# Patient Record
Sex: Female | Born: 1990 | Race: Black or African American | Hispanic: No | Marital: Single | Smoking: Never smoker
Health system: Southern US, Community
[De-identification: ages and names within clinical notes are randomized; demographics above are authoritative.]

## PROBLEM LIST (undated history)

## (undated) DIAGNOSIS — E669 Obesity, unspecified: Secondary | ICD-10-CM

## (undated) DIAGNOSIS — F329 Major depressive disorder, single episode, unspecified: Secondary | ICD-10-CM

## (undated) DIAGNOSIS — B009 Herpesviral infection, unspecified: Secondary | ICD-10-CM

## (undated) DIAGNOSIS — F32A Depression, unspecified: Secondary | ICD-10-CM

## (undated) DIAGNOSIS — F419 Anxiety disorder, unspecified: Secondary | ICD-10-CM

## (undated) HISTORY — PX: CLOSED REDUCTION WRIST FRACTURE: SHX1091

## (undated) HISTORY — DX: Depression, unspecified: F32.A

## (undated) HISTORY — DX: Major depressive disorder, single episode, unspecified: F32.9

---

## 2013-05-24 ENCOUNTER — Ambulatory Visit: Payer: Self-pay

## 2013-09-18 ENCOUNTER — Telehealth: Payer: Self-pay | Admitting: Nurse Practitioner

## 2013-09-18 NOTE — Telephone Encounter (Signed)
appt made

## 2013-09-28 ENCOUNTER — Ambulatory Visit: Payer: Self-pay | Admitting: Nurse Practitioner

## 2013-10-01 ENCOUNTER — Encounter: Payer: Self-pay | Admitting: Nurse Practitioner

## 2013-10-01 ENCOUNTER — Encounter: Payer: Self-pay | Admitting: Family Medicine

## 2013-10-01 ENCOUNTER — Ambulatory Visit (INDEPENDENT_AMBULATORY_CARE_PROVIDER_SITE_OTHER): Payer: 59 | Admitting: Nurse Practitioner

## 2013-10-01 VITALS — BP 120/74 | HR 76 | Temp 99.3°F | Ht 62.0 in | Wt 243.0 lb

## 2013-10-01 DIAGNOSIS — F411 Generalized anxiety disorder: Secondary | ICD-10-CM

## 2013-10-01 MED ORDER — ESCITALOPRAM OXALATE 10 MG PO TABS: 10.0000 mg | ORAL_TABLET | Freq: Every day | ORAL | Status: DC

## 2013-10-01 NOTE — Progress Notes (Signed)
  Subjective:    Patient ID: Angelica Bond, female    DOB: Jan 15, 1991, 22 y.o.   MRN: [id number]  HPI  Patient in  to discuss depression- She was put on Wellbutrin at the age of 66 and she said it did not help at all. Patient said that lately she has been having some anxiety- She says that her consciences tells her  not to do something then she goes and does it anyway and then she stresses about it. She also says that she can be happy one minute and the next minute doesn't want to speak to anyone- ALmost like she is moody and make compulsive decisions.    Review of Systems  All other systems reviewed and are negative.       Objective:   Physical Exam  Constitutional: She appears well-developed and well-nourished.  Cardiovascular: Normal rate, regular rhythm and normal heart sounds.   Pulmonary/Chest: Effort normal and breath sounds normal.  Skin: Skin is warm.  Psychiatric: She has a normal mood and affect. Her behavior is normal. Judgment and thought content normal.    BP 120/74  Pulse 76  Temp(Src) 99.3 F (37.4 C) (Oral)  Ht 5\' 2"  (1.575 m)  Wt 243 lb (110.224 kg)  BMI 44.43 kg/m2       Assessment & Plan:  1. Generalized anxiety disorder Meds ordered this encounter  Medications  . escitalopram (LEXAPRO) 10 MG tablet    Sig: Take 1 tablet (10 mg total) by mouth daily.    Dispense:  30 tablet    Refill:  3    Order Specific Question:  Supervising Provider    Answer:  Deborra Medina   Exercise Diet stress management Recheck in 3 weeks  Mary-Margaret Daphine Deutscher, FNP

## 2013-10-01 NOTE — Patient Instructions (Signed)

## 2013-10-19 ENCOUNTER — Ambulatory Visit (INDEPENDENT_AMBULATORY_CARE_PROVIDER_SITE_OTHER): Payer: 59 | Admitting: Physician Assistant

## 2013-10-19 VITALS — BP 118/70 | HR 58 | Temp 97.4°F | Ht 62.0 in | Wt 238.0 lb

## 2013-10-19 DIAGNOSIS — R300 Dysuria: Secondary | ICD-10-CM

## 2013-10-19 DIAGNOSIS — B9689 Other specified bacterial agents as the cause of diseases classified elsewhere: Secondary | ICD-10-CM

## 2013-10-19 DIAGNOSIS — A499 Bacterial infection, unspecified: Secondary | ICD-10-CM

## 2013-10-19 DIAGNOSIS — N898 Other specified noninflammatory disorders of vagina: Secondary | ICD-10-CM

## 2013-10-19 DIAGNOSIS — N390 Urinary tract infection, site not specified: Secondary | ICD-10-CM

## 2013-10-19 DIAGNOSIS — N760 Acute vaginitis: Secondary | ICD-10-CM

## 2013-10-19 LAB — POCT WET PREP WITH KOH
KOH Prep POC: NEGATIVE
Trichomonas, UA: NEGATIVE
Yeast Wet Prep HPF POC: NEGATIVE

## 2013-10-19 LAB — POCT UA - MICROSCOPIC ONLY
Casts, Ur, LPF, POC: NEGATIVE
Crystals, Ur, HPF, POC: NEGATIVE
Mucus, UA: NEGATIVE
RBC, urine, microscopic: NEGATIVE

## 2013-10-19 LAB — POCT URINALYSIS DIPSTICK
Blood, UA: NEGATIVE
Glucose, UA: NEGATIVE
Nitrite, UA: NEGATIVE
Spec Grav, UA: 1.01
Urobilinogen, UA: NEGATIVE
pH, UA: 7

## 2013-10-19 MED ORDER — NITROFURANTOIN MONOHYD MACRO 100 MG PO CAPS: 100.0000 mg | ORAL_CAPSULE | Freq: Two times a day (BID) | ORAL | Status: DC

## 2013-10-19 MED ORDER — METRONIDAZOLE 500 MG PO TABS: 500.0000 mg | ORAL_TABLET | Freq: Two times a day (BID) | ORAL | Status: DC

## 2013-10-19 NOTE — Progress Notes (Signed)
  Subjective:    Patient ID: Angelica Bond, female    DOB: 16-Jun-1991, 22 y.o.   MRN: [id number]  HPI 22 y/o AA female present with c/o worsening vaginal discharge, inflammation and burning with urination that started on Monday s/o unprotected sex on Sat and Sunday. Patient has not had recent multiple partners but states that her partner currently does. Patient has had BV and Chlamydia in the past.     Review of Systems  Constitutional: Negative.   Eyes: Negative.   Respiratory: Negative.   Genitourinary: Positive for dysuria, hematuria, vaginal bleeding, vaginal discharge and vaginal pain (irritation due to itching). Negative for urgency, frequency, flank pain, decreased urine volume, difficulty urinating, genital sores, menstrual problem, pelvic pain and dyspareunia.  Neurological: Negative.   Hematological: Negative.   Psychiatric/Behavioral: Negative.  Negative for behavioral problems.       Objective:   Physical Exam  Constitutional: She appears well-developed and well-nourished. No distress.  Abdominal: Soft. She exhibits no distension and no mass. There is no tenderness. There is no rebound and no guarding.  Skin: She is not diaphoretic.          Assessment & Plan:  1. Bacterial Vaginosis: KOH prep positive for clue cells. Treat with metronidazole 500mg  BID x 7 days.  2. UTI: U/A positive for leukocytes and WBC. Treat w/ Macrobid 100mg  x 5 days.  3. R/o Gonorrhea and Chlamydia due to recent unprotected sexual activity: Await path on urinalysis and treat patient and partner if positive.   Patient will call in for Diflucan if has s/s of candidal infection s/p treatment with antibiotics. She will rtc if s/s worsen or do not improve with treatment.

## 2013-11-02 ENCOUNTER — Encounter: Payer: Self-pay | Admitting: Nurse Practitioner

## 2013-11-02 ENCOUNTER — Ambulatory Visit (INDEPENDENT_AMBULATORY_CARE_PROVIDER_SITE_OTHER): Payer: 59 | Admitting: Nurse Practitioner

## 2013-11-02 VITALS — BP 124/83 | HR 69 | Temp 97.5°F | Ht 62.0 in | Wt 245.0 lb

## 2013-11-02 DIAGNOSIS — F411 Generalized anxiety disorder: Secondary | ICD-10-CM

## 2013-11-02 DIAGNOSIS — N390 Urinary tract infection, site not specified: Secondary | ICD-10-CM

## 2013-11-02 LAB — POCT URINALYSIS DIPSTICK
Bilirubin, UA: NEGATIVE
Blood, UA: NEGATIVE
Leukocytes, UA: NEGATIVE
Nitrite, UA: NEGATIVE
Urobilinogen, UA: NEGATIVE
pH, UA: 8.5

## 2013-11-02 LAB — POCT UA - MICROSCOPIC ONLY
Bacteria, U Microscopic: NEGATIVE
Casts, Ur, LPF, POC: NEGATIVE

## 2013-11-02 MED ORDER — VILAZODONE HCL 20 MG PO TABS: 1.0000 | ORAL_TABLET | Freq: Every day | ORAL | Status: DC

## 2013-11-02 NOTE — Patient Instructions (Signed)
Stress Management Stress is a state of physical or mental tension that often results from changes in your life or normal routine. Some common causes of stress are:  Death of a loved one.  Injuries or severe illnesses.  Getting fired or changing jobs.  Moving into a new home. Other causes may be:  Sexual problems.  Business or financial losses.  Taking on a large debt.  Regular conflict with someone at home or at work.  Constant tiredness from lack of sleep. It is not just bad things that are stressful. It may be stressful to:  Win the lottery.  Get married.  Buy a new car. The amount of stress that can be easily tolerated varies from person to person. Changes generally cause stress, regardless of the types of change. Too much stress can affect your health. It may lead to physical or emotional problems. Too little stress (boredom) may also become stressful. SUGGESTIONS TO REDUCE STRESS:  Talk things over with your family and friends. It often is helpful to share your concerns and worries. If you feel your problem is serious, you may want to get help from a professional counselor.  Consider your problems one at a time instead of lumping them all together. Trying to take care of everything at once may seem impossible. List all the things you need to do and then start with the most important one. Set a goal to accomplish 2 or 3 things each day. If you expect to do too many in a single day you will naturally fail, causing you to feel even more stressed.  Do not use alcohol or drugs to relieve stress. Although you may feel better for a short time, they do not remove the problems that caused the stress. They can also be habit forming.  Exercise regularly - at least 3 times per week. Physical exercise can help to relieve that "uptight" feeling and will relax you.  The shortest distance between despair and hope is often a good night's sleep.  Go to bed and get up on time allowing  yourself time for appointments without being rushed.  Take a short "time-out" period from any stressful situation that occurs during the day. Close your eyes and take some deep breaths. Starting with the muscles in your face, tense them, hold it for a few seconds, then relax. Repeat this with the muscles in your neck, shoulders, hand, stomach, back and legs.  Take good care of yourself. Eat a balanced diet and get plenty of rest.  Schedule time for having fun. Take a break from your daily routine to relax. HOME CARE INSTRUCTIONS   Call if you feel overwhelmed by your problems and feel you can no longer manage them on your own.  Return immediately if you feel like hurting yourself or someone else. Document Released: 06/08/2001 Document Revised: 03/06/2012 Document Reviewed: 08/07/2013 [employer] Patient Information 2014 [employer].  

## 2013-11-02 NOTE — Progress Notes (Signed)
  Subjective:    Patient ID: Angelica Bond, female    DOB: 31-Dec-1990, 22 y.o.   MRN: [id number]  HPI Patient in today for follow up of GAD- we recently put her on lexapro- Patient says that she really doesn't see any change and she is having frequent headaches since starting lexapro. Patient says she still has mood swings from being really happy to sad and doesn;t want to talk to anyone.  * Patient also C/o dysuria and frequency  Review of Systems  Constitutional: Negative.   HENT: Negative.   Eyes: Negative.   Respiratory: Negative.   Cardiovascular: Negative.   Gastrointestinal: Negative.   Endocrine: Negative.   Genitourinary: Negative.   Musculoskeletal: Negative.   Neurological: Negative.   Hematological: Negative.   Psychiatric/Behavioral: Negative.        Objective:   Physical Exam  Constitutional: She appears well-developed and well-nourished.  Cardiovascular: Normal rate, normal heart sounds and intact distal pulses.   Pulmonary/Chest: Effort normal and breath sounds normal.  Psychiatric: She has a normal mood and affect. Her behavior is normal. Judgment and thought content normal.    BP 124/83  Pulse 69  Temp(Src) 97.5 F (36.4 C) (Oral)  Ht 5\' 2"  (1.575 m)  Wt 245 lb (111.131 kg)  BMI 44.80 kg/m2  Results for orders placed in visit on 11/02/13  POCT URINALYSIS DIPSTICK      Result Value Range   Color, UA YELLOW     Clarity, UA CLOUDY     Glucose, UA NEG     Bilirubin, UA NEG     Ketones, UA NEG     Spec Grav, UA 1.010     Blood, UA NEG     pH, UA 8.5     Protein, UA SMALL     Urobilinogen, UA negative     Nitrite, UA NEG     Leukocytes, UA Negative    POCT UA - MICROSCOPIC ONLY      Result Value Range   WBC, Ur, HPF, POC OCC     RBC, urine, microscopic OCC     Bacteria, U Microscopic NEG     Mucus, UA TRACE     Epithelial cells, urine per micros OCC     Crystals, Ur, HPF, POC NEG     Casts, Ur, LPF, POC NEG     Amorphous, UA OCC           Assessment & Plan:   1. Infection of urinary tract   2. Generalized anxiety disorder    Meds ordered this encounter  Medications  . Vilazodone HCl (VIIBRYD) 20 MG TABS    Sig: Take 1 tablet (20 mg total) by mouth daily.    Dispense:  30 tablet    Refill:  1    Order Specific Question:  Supervising Provider    Answer:  Deborra Medina   Stress management Follow up in 3 months Force fluids RTO in 2 months  Mary-Margaret Daphine Deutscher, FNP

## 2013-11-06 ENCOUNTER — Encounter: Payer: Self-pay | Admitting: *Deleted

## 2013-11-06 NOTE — Progress Notes (Signed)
Quick Note:  Copy of labs sent to patient ______ 

## 2015-08-05 ENCOUNTER — Ambulatory Visit (INDEPENDENT_AMBULATORY_CARE_PROVIDER_SITE_OTHER): Payer: BLUE CROSS/BLUE SHIELD | Admitting: Physician Assistant

## 2015-08-05 ENCOUNTER — Encounter (INDEPENDENT_AMBULATORY_CARE_PROVIDER_SITE_OTHER): Payer: Self-pay

## 2015-08-05 ENCOUNTER — Encounter: Payer: Self-pay | Admitting: Physician Assistant

## 2015-08-05 VITALS — BP 129/84 | HR 74 | Temp 97.6°F | Ht 62.0 in | Wt 239.0 lb

## 2015-08-05 DIAGNOSIS — M25561 Pain in right knee: Secondary | ICD-10-CM

## 2015-08-05 DIAGNOSIS — E669 Obesity, unspecified: Secondary | ICD-10-CM | POA: Diagnosis not present

## 2015-08-05 MED ORDER — PHENTERMINE HCL 37.5 MG PO CAPS: 37.5000 mg | ORAL_CAPSULE | ORAL | Status: DC

## 2015-08-05 MED ORDER — PREDNISONE 10 MG (21) PO TBPK: ORAL_TABLET | ORAL | Status: DC

## 2015-08-05 MED ORDER — MELOXICAM 15 MG PO TABS: 15.0000 mg | ORAL_TABLET | Freq: Every day | ORAL | Status: DC

## 2015-08-05 NOTE — Progress Notes (Signed)
   Subjective:    Patient ID: Angelica Bond, female    DOB: Apr 24, 1991, 24 y.o.   MRN: [id number]  HPI 24 y/o female presents for c/o right nagging, aching pain. Was initially intermittent but has worsened and is now hurting whether she is sitting or standing. She hurt her knee during a squat move 1-2 years ago. Pain resolved but has recurred. She recently started going back to the gym and has lost 20 pounds but pain is preventing her from exercise at this time.   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   She has been taking Advil for pain, which initially helped but does not anymore.      Review of Systems  Constitutional: Negative.   HENT: Negative.   Eyes: Negative.   Respiratory: Negative.   Cardiovascular: Negative.   Gastrointestinal: Negative.   Musculoskeletal: Positive for arthralgias (right knee, worse with bending and standing ). Negative for joint swelling.  Neurological: Negative for weakness and numbness.       Objective:   Physical Exam  Constitutional: She is oriented to person, place, and time. She appears well-developed and well-nourished.  Abdominal:  Obese   Musculoskeletal: She exhibits tenderness (mild ttp superior lateral patella ). She exhibits no edema.  No joint line ttp, no laxity or crepitus on right knee  Negative for edema No pain with varus or valgus   Neurological: She is alert and oriented to person, place, and time.  Psychiatric: She has a normal mood and affect. Her behavior is normal. Judgment and thought content normal.  Nursing note and vitals reviewed.         Assessment & Plan:  1. Obesity  - phentermine 37.5 MG capsule; Take 1 capsule (37.5 mg total) by mouth every morning.  Dispense: 30 capsule; Refill: 2  2. Right knee pain  - meloxicam (MOBIC) 15 MG tablet; Take 1 tablet (15 mg total) by mouth daily.  Dispense: 30 tablet; Refill: 0 - predniSONE (STERAPRED  UNI-PAK 21 TAB) 10 MG (21) TBPK tablet; 6 pills PO on day 1, 5 on day 2, 4 on day 3, 3 on day 4, 2 on day 5, 1 on day 6  Dispense: 21 tablet; Refill: 0   RTO 3 weeks   Jaris Kohles A. Chauncey Reading PA-C

## 2015-08-05 NOTE — Patient Instructions (Signed)

## 2015-08-27 ENCOUNTER — Ambulatory Visit: Payer: BLUE CROSS/BLUE SHIELD | Admitting: Physician Assistant

## 2015-09-10 ENCOUNTER — Ambulatory Visit (INDEPENDENT_AMBULATORY_CARE_PROVIDER_SITE_OTHER): Payer: BLUE CROSS/BLUE SHIELD | Admitting: Physician Assistant

## 2015-09-10 ENCOUNTER — Encounter: Payer: Self-pay | Admitting: Physician Assistant

## 2015-09-10 VITALS — BP 126/82 | HR 80 | Temp 97.4°F | Ht 62.0 in | Wt 237.0 lb

## 2015-09-10 DIAGNOSIS — M25561 Pain in right knee: Secondary | ICD-10-CM

## 2015-09-10 DIAGNOSIS — E669 Obesity, unspecified: Secondary | ICD-10-CM

## 2015-09-10 NOTE — Progress Notes (Signed)
   Subjective:    Patient ID: Angelica Bond, female    DOB: [birth year], 24 y.o.   MRN: [id number]  HPI 24 y/o female presents for follow up of right knee pain. She has completed treatmnet with prednisone and mobic. She states that her pain is much improved and she has been able to return to the gym, mild pain with doing squats but no pain with standing at her job, etc.     Review of Systems  Constitutional: Negative.   HENT: Negative.   Eyes: Negative.   Respiratory: Negative.   Cardiovascular: Negative.   Gastrointestinal: Negative.   Endocrine: Negative.   Genitourinary: Negative.   Musculoskeletal: Negative.   Skin: Negative.   Allergic/Immunologic: Negative.   Neurological: Negative.   Hematological: Negative.   Psychiatric/Behavioral: Negative.        Objective:   Physical Exam  Constitutional: She is oriented to person, place, and time. She appears well-developed and well-nourished. No distress.  Musculoskeletal: Normal range of motion. She exhibits no edema or tenderness.  Neurological: She is alert and oriented to person, place, and time.  Skin: She is not diaphoretic.  Psychiatric: She has a normal mood and affect. Her behavior is normal. Judgment and thought content normal.  Nursing note and vitals reviewed.         Assessment & Plan:  1. Right knee pain - Ease back into exercise, limiting weight involved on knees - If pain recurs, take ibuprofen or aleve   2. Obesity - Continue to exercise daily -  Take phentermine as directed   RTO prn   Cierah Crader A. Chauncey Reading PA-C

## 2015-10-02 ENCOUNTER — Telehealth: Payer: Self-pay | Admitting: Family Medicine

## 2015-10-02 NOTE — Telephone Encounter (Signed)
Stp who is requesting refill on xanax, advised pt we don't have xanax on her med list and weren't aware she was taking it. Pt states her psych dr put her on it since her last visit with [place]. Pt advised to call her psych dr since they started her on it. Pt voiced understanding.

## 2015-11-25 ENCOUNTER — Telehealth: Payer: Self-pay | Admitting: Nurse Practitioner

## 2016-02-18 ENCOUNTER — Telehealth: Payer: Self-pay | Admitting: Nurse Practitioner

## 2016-04-29 ENCOUNTER — Ambulatory Visit (INDEPENDENT_AMBULATORY_CARE_PROVIDER_SITE_OTHER): Payer: BLUE CROSS/BLUE SHIELD | Admitting: Family

## 2016-04-29 ENCOUNTER — Encounter (INDEPENDENT_AMBULATORY_CARE_PROVIDER_SITE_OTHER): Payer: Self-pay

## 2016-04-29 ENCOUNTER — Encounter: Payer: Self-pay | Admitting: Family

## 2016-04-29 VITALS — BP 155/95 | HR 71 | Temp 97.3°F | Ht 62.0 in | Wt 269.0 lb

## 2016-04-29 DIAGNOSIS — A499 Bacterial infection, unspecified: Secondary | ICD-10-CM | POA: Diagnosis not present

## 2016-04-29 DIAGNOSIS — N760 Acute vaginitis: Secondary | ICD-10-CM

## 2016-04-29 DIAGNOSIS — N898 Other specified noninflammatory disorders of vagina: Secondary | ICD-10-CM

## 2016-04-29 DIAGNOSIS — B9689 Other specified bacterial agents as the cause of diseases classified elsewhere: Secondary | ICD-10-CM

## 2016-04-29 LAB — URINALYSIS
Bilirubin, UA: NEGATIVE
Glucose, UA: NEGATIVE
Ketones, UA: NEGATIVE
Leukocytes, UA: NEGATIVE
NITRITE UA: NEGATIVE
PH UA: 7 (ref 5.0–7.5)
Protein, UA: NEGATIVE
Specific Gravity, UA: 1.02 (ref 1.005–1.030)
Urobilinogen, Ur: 0.2 mg/dL (ref 0.2–1.0)

## 2016-04-29 LAB — URINALYSIS, MICROSCOPIC ONLY: BACTERIA UA: NONE SEEN

## 2016-04-29 LAB — WET PREP FOR TRICH, YEAST, CLUE
Clue Cell Exam: NEGATIVE
TRICHOMONAS EXAM: NEGATIVE
YEAST EXAM: NEGATIVE

## 2016-04-29 MED ORDER — METRONIDAZOLE 500 MG PO TABS: 500.0000 mg | ORAL_TABLET | Freq: Two times a day (BID) | ORAL | Status: DC

## 2016-04-29 MED ORDER — METRONIDAZOLE 0.75 % VA GEL: 1.0000 | Freq: Every day | VAGINAL | Status: DC

## 2016-04-29 NOTE — Patient Instructions (Signed)

## 2016-04-29 NOTE — Progress Notes (Signed)
   Subjective:    Patient ID: Angelica Bond, female    DOB: Apr 11, 1991, 25 y.o.   MRN: [id number]  Vaginal Itching The patient's primary symptoms include genital itching, a genital odor and vaginal discharge. This is a new problem. The current episode started in the past 7 days. The problem occurs 2 to 4 times per day. The problem has been unchanged. Associated symptoms include painful intercourse. Pertinent negatives include no chills, constipation, diarrhea, discolored urine, dysuria, flank pain, frequency, headaches, hematuria or urgency. The vaginal discharge was white. Nothing aggravates the symptoms. She has tried nothing for the symptoms. The treatment provided no relief.      Review of Systems  Constitutional: Negative.  Negative for chills.  HENT: Negative.   Eyes: Negative.   Respiratory: Negative.  Negative for shortness of breath.   Cardiovascular: Negative.  Negative for palpitations.  Gastrointestinal: Negative.  Negative for diarrhea and constipation.  Endocrine: Negative.   Genitourinary: Positive for vaginal discharge. Negative for dysuria, urgency, frequency, hematuria and flank pain.  Musculoskeletal: Negative.   Neurological: Negative.  Negative for headaches.  Hematological: Negative.   Psychiatric/Behavioral: Negative.   All other systems reviewed and are negative.      Objective:   Physical Exam  Constitutional: She is oriented to person, place, and time. She appears well-developed and well-nourished. No distress.  Eyes: Pupils are equal, round, and reactive to light.  Neck: Normal range of motion. Neck supple. No thyromegaly present.  Cardiovascular: Normal rate, regular rhythm, normal heart sounds and intact distal pulses.   No murmur heard. Pulmonary/Chest: Effort normal and breath sounds normal. No respiratory distress. She has no wheezes.  Abdominal: Soft. Bowel sounds are normal. She exhibits no distension. There is no tenderness.  Musculoskeletal:  Normal range of motion. She exhibits no edema or tenderness.  Negative for CVA tenderness   Neurological: She is alert and oriented to person, place, and time.  Skin: Skin is warm and dry.  Psychiatric: She has a normal mood and affect. Her behavior is normal. Judgment and thought content normal.  Vitals reviewed.   BP 155/95 mmHg  Pulse 71  Temp(Src) 97.3 F (36.3 C) (Oral)  Ht 5\' 2"  (1.575 m)  Wt 269 lb (122.018 kg)  BMI 49.19 kg/m2       Assessment & Plan:  1. Vaginal irritation - Urinalysis - Urine Microscopic - WET PREP FOR TRICH, YEAST, CLUE  2. BV (bacterial vaginosis) -Keep clean and dry -Take probiotic -Do not douche -RTO prn - metroNIDAZOLE (METROGEL) 0.75 % vaginal gel; Place 1 Applicatorful vaginally at bedtime.  Dispense: 70 g; Refill: 0  Jannifer Rodneyhristy Shantoya Geurts, FNP

## 2016-04-29 NOTE — Addendum Note (Signed)
Addended by: Jannifer RodneyHAWKS, Lorae Roig A on: 04/29/2016 03:31 PM   Modules accepted: Orders

## 2016-08-31 ENCOUNTER — Telehealth: Payer: Self-pay | Admitting: Nurse Practitioner

## 2016-08-31 NOTE — Telephone Encounter (Signed)
Patient aware that she will need to be seen  

## 2016-11-10 ENCOUNTER — Encounter: Payer: Self-pay | Admitting: Pediatrics

## 2016-11-10 ENCOUNTER — Ambulatory Visit (INDEPENDENT_AMBULATORY_CARE_PROVIDER_SITE_OTHER): Payer: BLUE CROSS/BLUE SHIELD | Admitting: Pediatrics

## 2016-11-10 VITALS — BP 119/79 | HR 74 | Temp 97.9°F | Ht 62.0 in | Wt 252.2 lb

## 2016-11-10 DIAGNOSIS — N926 Irregular menstruation, unspecified: Secondary | ICD-10-CM

## 2016-11-10 DIAGNOSIS — R109 Unspecified abdominal pain: Secondary | ICD-10-CM

## 2016-11-10 DIAGNOSIS — R195 Other fecal abnormalities: Secondary | ICD-10-CM | POA: Diagnosis not present

## 2016-11-10 NOTE — Progress Notes (Signed)
  Subjective:   Patient ID: Oretha Milch, female    DOB: [birth year], 25 y.o.   MRN: [id number] CC: Back Pain; Trouble Digesting food; Bloated; and Low abdominal pain  HPI: Angelica Bond is a 25 y.o. female presenting for Back Pain; Trouble Digesting food; Bloated; and Low abdominal pain  Everytime she eats, whether water, salad, cheeseburger, feels like she gets bloated in the upper abd area, then gets pain in lower abd  When she eats cheese or greasy foods often has to throw it up, says she doesn't digest it Non-bloody, non-bilious vomit Says she has to force herself to throw up Inconsistent stooling, says usually loose Goes 2-3 times in the morning most mornings Says ongoing for 4 years  Works 1-10pm Drinks water, gatorade, juice, minimal sodas  Never woken up in the morning to have stooling Can eat ice cream without pain  Took gas-x yesterday lower back pain helped Not having regular periods Missed October Unprotected sex x1 about 6 weeks ago Period last week shorter than usual  Was doing cocaine occasionally up until a month ago Now stopped hanging out with the people who she was doing it with  Relevant past medical, surgical, family and social history reviewed. Allergies and medications reviewed and updated. History  Smoking Status  . Never Smoker  Smokeless Tobacco  . Never Used   ROS: Per HPI   Objective:    BP 119/79   Pulse 74   Temp 97.9 F (36.6 C) (Oral)   Ht _0  (1.575 m)   Wt 252 lb 3.2 oz (114.4 kg)   BMI 46.13 kg/m   Wt Readings from Last 3 Encounters:  11/10/16 252 lb 3.2 oz (114.4 kg)  04/29/16 269 lb (122 kg)  09/10/15 237 lb (107.5 kg)    Gen: NAD, alert, cooperative with exam, NCAT EYES: EOMI, no conjunctival injection, or no icterus ENT:  TMs dull gray b/l, OP without erythema LYMPH: no cervical LAD CV: NRRR, normal S1/S2, no murmur, distal pulses 2+ b/l Resp: CTABL, no wheezes, normal WOB Abd: +BS, soft, NTND. no guarding or  organomegaly, neg murphy's sign Ext: No edema, warm Neuro: Alert and oriented, strength equal b/l UE and LE, coordination grossly normal MSK: normal muscle bulk  Assessment & Plan:  Rayona was seen today for back pain, trouble digesting food, bloated and low abdominal pain.  Diagnoses and all orders for this visit:  Missed period Home urine preg test neg, pt doesn't want to repeat urine test here but wants to make sure she is not pregnant Unprotected sex apprx 6 weeks ago -     Beta HCG, Quant  Abdominal pain, unspecified abdominal location Gave FODMAPs diet Avoid greasy foods Keep food diary of foods that make pain worse Check labs as below -     CMP14+EGFR -     CBC with Differential/Platelet -     Sedimentation rate  Loose stools -     Sedimentation rate  H/o cocaine use Continue abstinence, recently stopped, no plans to restart, trying to avoid friend group that was using  Follow up plan: Return in about 4 weeks (around 12/08/2016) for follow up. Assunta Found, MD [place]

## 2016-11-17 ENCOUNTER — Other Ambulatory Visit: Payer: BLUE CROSS/BLUE SHIELD

## 2016-11-18 LAB — CMP14+EGFR
ALT: 24 IU/L (ref 0–32)
AST: 19 IU/L (ref 0–40)
Albumin/Globulin Ratio: 1.8 (ref 1.2–2.2)
Albumin: 4.2 g/dL (ref 3.5–5.5)
Alkaline Phosphatase: 61 IU/L (ref 39–117)
BUN/Creatinine Ratio: 8 — ABNORMAL LOW (ref 9–23)
BUN: 7 mg/dL (ref 6–20)
Bilirubin Total: 0.8 mg/dL (ref 0.0–1.2)
CALCIUM: 9 mg/dL (ref 8.7–10.2)
CO2: 22 mmol/L (ref 18–29)
CREATININE: 0.87 mg/dL (ref 0.57–1.00)
Chloride: 103 mmol/L (ref 96–106)
GFR, EST AFRICAN AMERICAN: 106 mL/min/{1.73_m2} (ref >59)
GFR, EST NON AFRICAN AMERICAN: 92 mL/min/{1.73_m2} (ref >59)
GLUCOSE: 89 mg/dL (ref 65–99)
Globulin, Total: 2.4 g/dL (ref 1.5–4.5)
Potassium: 4.6 mmol/L (ref 3.5–5.2)
Sodium: 140 mmol/L (ref 134–144)
TOTAL PROTEIN: 6.6 g/dL (ref 6.0–8.5)

## 2016-11-18 LAB — CBC WITH DIFFERENTIAL/PLATELET
BASOS ABS: 0 10*3/uL (ref 0.0–0.2)
Basos: 0 %
EOS (ABSOLUTE): 0.2 10*3/uL (ref 0.0–0.4)
EOS: 3 %
HEMATOCRIT: 40.1 % (ref 34.0–46.6)
Hemoglobin: 13.4 g/dL (ref 11.1–15.9)
IMMATURE GRANULOCYTES: 0 %
Immature Grans (Abs): 0 10*3/uL (ref 0.0–0.1)
Lymphocytes Absolute: 1.7 10*3/uL (ref 0.7–3.1)
Lymphs: 22 %
MCH: 29.2 pg (ref 26.6–33.0)
MCHC: 33.4 g/dL (ref 31.5–35.7)
MCV: 87 fL (ref 79–97)
MONOS ABS: 0.5 10*3/uL (ref 0.1–0.9)
Monocytes: 6 %
NEUTROS PCT: 69 %
Neutrophils Absolute: 5.3 10*3/uL (ref 1.4–7.0)
PLATELETS: 325 10*3/uL (ref 150–379)
RBC: 4.59 x10E6/uL (ref 3.77–5.28)
RDW: 12.6 % (ref 12.3–15.4)
WBC: 7.8 10*3/uL (ref 3.4–10.8)

## 2016-11-18 LAB — BETA HCG QUANT (REF LAB)

## 2016-11-18 LAB — SEDIMENTATION RATE: SED RATE: 17 mm/h (ref 0–32)

## 2016-11-30 ENCOUNTER — Ambulatory Visit (INDEPENDENT_AMBULATORY_CARE_PROVIDER_SITE_OTHER): Payer: BLUE CROSS/BLUE SHIELD | Admitting: Family

## 2016-11-30 ENCOUNTER — Encounter: Payer: Self-pay | Admitting: Family

## 2016-11-30 VITALS — BP 126/75 | HR 66 | Temp 97.8°F | Ht 62.0 in | Wt 254.0 lb

## 2016-11-30 DIAGNOSIS — J0110 Acute frontal sinusitis, unspecified: Secondary | ICD-10-CM | POA: Diagnosis not present

## 2016-11-30 MED ORDER — AMOXICILLIN-POT CLAVULANATE 875-125 MG PO TABS: 1.0000 | ORAL_TABLET | Freq: Two times a day (BID) | ORAL | 0 refills | Status: DC

## 2016-11-30 NOTE — Patient Instructions (Signed)

## 2016-11-30 NOTE — Progress Notes (Signed)
   Subjective:    Patient ID: Angelica Bond, female    DOB: [birth year]Lore[birth year] Bond, 25 y.o.   MRN: [id number]  Cough  This is a new problem. Associated symptoms include ear pain and headaches. Pertinent negatives include no shortness of breath.  Sore Throat   This is a new problem. The current episode started in the past 7 days. The problem has been waxing and waning. There has been no fever. The pain is at a severity of 5/10. The pain is moderate. Associated symptoms include congestion, coughing, ear pain, headaches, a hoarse voice, a plugged ear sensation, swollen glands and trouble swallowing. Pertinent negatives include no ear discharge or shortness of breath. She has tried NSAIDs, acetaminophen and gargles for the symptoms. The treatment provided mild relief.      Review of Systems  HENT: Positive for congestion, ear pain, hoarse voice and trouble swallowing. Negative for ear discharge.   Respiratory: Positive for cough. Negative for shortness of breath.   Neurological: Positive for headaches.  All other systems reviewed and are negative.      Objective:   Physical Exam  Constitutional: She is oriented to person, place, and time. She appears well-developed and well-nourished. No distress.  HENT:  Head: Normocephalic and atraumatic.  Right Ear: There is tenderness.  Nose: Mucosal edema and rhinorrhea present. Right sinus exhibits frontal sinus tenderness. Left sinus exhibits frontal sinus tenderness.  Mouth/Throat: Posterior oropharyngeal erythema present.  Eyes: Pupils are equal, round, and reactive to light.  Neck: Normal range of motion. Neck supple. No thyromegaly present.  Cardiovascular: Normal rate, regular rhythm, normal heart sounds and intact distal pulses.   No murmur heard. Pulmonary/Chest: Effort normal and breath sounds normal. No respiratory distress. She has no wheezes.  Abdominal: Soft. Bowel sounds are normal. She exhibits no distension. There is no tenderness.    Musculoskeletal: Normal range of motion. She exhibits no edema or tenderness.  Neurological: She is alert and oriented to person, place, and time. She has normal reflexes. No cranial nerve deficit.  Skin: Skin is warm and dry.  Psychiatric: She has a normal mood and affect. Her behavior is normal. Judgment and thought content normal.  Vitals reviewed.     BP 126/75   Pulse 66   Temp 97.8 F (36.6 C) (Oral)   Ht 5\' 2"  (1.575 m)   Wt 254 lb (115.2 kg)   BMI 46.46 kg/m      Assessment & Plan:  1. Acute frontal sinusitis, recurrence not specified - Take meds as prescribed - Use a cool mist humidifier  -Use saline nose sprays frequently -Saline irrigations of the nose can be very helpful if done frequently.  * 4X daily for 1 week*  * Use of a nettie pot can be helpful with this. Follow directions with this* -Force fluids -For any cough or congestion  Use plain Mucinex- regular strength or max strength is fine   * Children- consult with Pharmacist for dosing -For fever or aces or pains- take tylenol or ibuprofen appropriate for age and weight.  * for fevers greater than 101 orally you may alternate ibuprofen and tylenol every  3 hours. -Throat lozenges if help -New toothbrush in 3 days - amoxicillin-clavulanate (AUGMENTIN) 875-125 MG tablet; Take 1 tablet by mouth 2 (two) times daily.  Dispense: 14 tablet; Refill: 0  Angelica Rodneyhristy Hawks, FNP

## 2016-12-01 ENCOUNTER — Other Ambulatory Visit: Payer: Self-pay | Admitting: Physician Assistant

## 2016-12-01 MED ORDER — BENZONATATE 200 MG PO CAPS: 200.0000 mg | ORAL_CAPSULE | Freq: Two times a day (BID) | ORAL | 0 refills | Status: DC | PRN

## 2016-12-01 MED ORDER — FLUTICASONE PROPIONATE 50 MCG/ACT NA SUSP: 1.0000 | Freq: Two times a day (BID) | NASAL | 6 refills | Status: DC | PRN

## 2016-12-01 NOTE — Telephone Encounter (Signed)
Patient aware.

## 2017-01-25 ENCOUNTER — Telehealth: Payer: Self-pay | Admitting: Nurse Practitioner

## 2017-01-25 NOTE — Telephone Encounter (Signed)
Had at [facility] °

## 2017-06-30 ENCOUNTER — Ambulatory Visit (INDEPENDENT_AMBULATORY_CARE_PROVIDER_SITE_OTHER): Payer: 59

## 2017-06-30 ENCOUNTER — Other Ambulatory Visit: Payer: Self-pay | Admitting: Physician Assistant

## 2017-06-30 ENCOUNTER — Ambulatory Visit (INDEPENDENT_AMBULATORY_CARE_PROVIDER_SITE_OTHER): Payer: 59 | Admitting: Physician Assistant

## 2017-06-30 DIAGNOSIS — W19XXXA Unspecified fall, initial encounter: Secondary | ICD-10-CM

## 2017-06-30 DIAGNOSIS — S52501A Unspecified fracture of the lower end of right radius, initial encounter for closed fracture: Secondary | ICD-10-CM

## 2017-06-30 DIAGNOSIS — M25531 Pain in right wrist: Secondary | ICD-10-CM | POA: Diagnosis not present

## 2017-06-30 NOTE — Progress Notes (Signed)
Subjective:     Patient ID: Angelica Bond, female   DOB: 08/04/1991, 26 y.o.   MRN: [id number]  HPI Pt with fall last pm Awoke this am with pain and deformity to the R wrist Denies numbness to the hand No meds for sx  Review of Systems  Constitutional: Negative.   Musculoskeletal: Positive for arthralgias, joint swelling and myalgias.       Objective:   Physical Exam  Constitutional: She appears well-developed and well-nourished.  + ecchy , edema ,and deformity noted to the R wrist Good pulses at wrist Sig decrease in ROM due to sx Good cap rf and sensory distal Xray showed sig displaced distal radius fx Pt place in short arm splint      Assessment:     1. Closed fracture of distal end of right radius, unspecified fracture morphology, initial encounter   2. Fall, initial encounter        Plan:     Pt placed in short arm splint Immed referral to ortho Pt instructed not to eat F/U here prn

## 2017-06-30 NOTE — Patient Instructions (Signed)
Radial Fracture  A radial fracture is a break in the radius bone, which is the long bone of the forearm that is on the same side as your thumb. Your forearm is the part of your arm that is between your elbow and your wrist. It is made up of two bones: the radius and the ulna.  Most radial fractures occur near the wrist (distal radialfracture) or near the elbow (radial head fracture). A distal radial fracture is the most common type of broken arm. This fracture usually occurs about an inch above the wrist. Fractures of the middle part of the bone are less common.  What are the causes?  Falling with your arm outstretched is the most common cause of a radial fracture. Other causes include:   Car accidents.   Bike accidents.   A direct blow to the middle part of the radius.    What increases the risk?   You may be at greater risk for a distal radial fracture if you are 60 years of age or older.   You may be at greater risk for a radial head fracture if you are:  ? Female.  ? 30-40 years old.   You may be at a greater risk for all types of radial fractures if you have a condition that causes your bones to be weak or thin (osteoporosis).  What are the signs or symptoms?  A radial fracture causes pain immediately after the injury. Other signs and symptoms include:   An abnormal bend or bump in your arm (deformity).   Swelling.   Bruising.   Numbness or tingling.   Tenderness.   Limited movement.    How is this diagnosed?  Your health care provider may diagnose a radial fracture based on:   Your symptoms.   Your medical history, including any recent injury.   A physical exam. Your health care provider will look for any deformity and feel for tenderness over the break. Your health care provider will also check whether the bone is out of place.   An X-ray exam to confirm the diagnosis and learn more about the type of fracture.    How is this treated?  The goals of treatment are to get the bone in proper  position for healing and to keep it from moving so it will heal over time. Your treatment will depend on many factors, especially the type of fracture that you have.   If the fractured bone:  ? Is in the correct position (nondisplaced), you may only need to wear a cast or a splint.  ? Has a slightly displaced fracture, you may need to have the bones moved back into place manually (closed reduction) before the splint or cast is put on.   You may have a temporary splint before you have a plaster cast. The splint allows room for some swelling. After a few days, a cast can replace the splint.  ? You may have to wear the cast for about 6 weeks or as directed by your health care provider.  ? The cast may be changed after about 3 weeks or as directed by your health care provider.   After your cast is taken off, you may need physical therapy to regain full movement in your wrist or elbow.   You may need emergency surgery if you have:  ? A fractured bone that is out of position (displaced).  ? A fracture with multiple fragments (comminuted fracture).  ? A fracture   that breaks the skin (open fracture). This type of fracture may require surgical wires, plates, or screws to hold the bone in place.   You may have X-rays every couple of weeks to check on your healing.    Follow these instructions at home:   Keep the injured arm above the level of your heart while you are sitting or lying down. This helps to reduce swelling and pain.   Apply ice to the injured area:  ? Put ice in a plastic bag.  ? Place a towel between your skin and the bag.  ? Leave the ice on for 20 minutes, 2-3 times per day.   Move your fingers often to avoid stiffness and to minimize swelling.   If you have a plaster or fiberglass cast:  ? Do not try to scratch the skin under the cast using sharp or pointed objects.  ? Check the skin around the cast every day. You may put lotion on any red or sore areas.  ? Keep your cast dry and clean.   If you  have a plaster splint:  ? Wear the splint as directed.  ? Loosen the elastic around the splint if your fingers become numb and tingle, or if they turn cold and blue.   Do not put pressure on any part of your cast until it is fully hardened. Rest your cast only on a pillow for the first 24 hours.   Protect your cast or splint while bathing or showering, as directed by your health care provider. Do not put your cast or splint into water.   Take medicines only as directed by your health care provider.   Return to activities, such as sports, as directed by your health care provider. Ask your health care provider what activities are safe for you.   Keep all follow-up visits as directed by your health care provider. This is important.  Contact a health care provider if:   Your pain medicine is not helping.   Your cast gets damaged or it breaks.   Your cast becomes loose.   Your cast gets wet.   You have more severe pain or swelling than you did before the cast.   You have severe pain when stretching your fingers.   You continue to have pain or stiffness in your elbow or your wrist after your cast is taken off.  Get help right away if:   You cannot move your fingers.   You lose feeling in your fingers or your hand.   Your hand or your fingers turn cold and pale or blue.   You notice a bad smell coming from your cast.   You have drainage from underneath your cast.   You have new stains from blood or drainage seeping through your cast.  This information is not intended to replace advice given to you by your health care provider. Make sure you discuss any questions you have with your health care provider.  Document Released: 05/26/2006 Document Revised: 05/18/2016 Document Reviewed: 06/07/2014  Elsevier Interactive Patient Education  2018 [employer]

## 2017-07-07 ENCOUNTER — Encounter: Payer: Self-pay | Admitting: Physician Assistant

## 2017-07-07 ENCOUNTER — Ambulatory Visit (INDEPENDENT_AMBULATORY_CARE_PROVIDER_SITE_OTHER): Payer: 59 | Admitting: Physician Assistant

## 2017-07-07 VITALS — BP 124/75 | HR 99 | Temp 96.8°F | Ht 62.0 in | Wt 251.0 lb

## 2017-07-07 DIAGNOSIS — B379 Candidiasis, unspecified: Secondary | ICD-10-CM

## 2017-07-07 MED ORDER — FLUCONAZOLE 150 MG PO TABS: ORAL_TABLET | ORAL | 0 refills | Status: DC

## 2017-07-07 MED ORDER — FLUCONAZOLE 150 MG PO TABS: ORAL_TABLET | ORAL | 2 refills | Status: DC

## 2017-07-07 NOTE — Patient Instructions (Signed)
In a few days you may receive a survey in the mail or online from [employer] regarding your visit with us today. Please take a moment to fill this out. Your feedback is very important to our whole office. It can help us better understand your needs as well as improve your experience and satisfaction. Thank you for taking your time to complete it. We care about you.  Baylyn Sickles, PA-C  

## 2017-07-08 ENCOUNTER — Encounter: Payer: Self-pay | Admitting: Physician Assistant

## 2017-07-08 NOTE — Progress Notes (Signed)
BP 124/75   Pulse 99   Temp (!) 96.8 F (36 C) (Oral)   Ht 5\' 2"  (1.575 m)   Wt 251 lb (113.9 kg)   BMI 45.91 kg/m    Subjective:    Patient ID: Angelica Bond, female    DOB: 1991/04/23, 26 y.o.   MRN: [id number]  HPI: Angelica SlotBriana Totzke is a 26 y.o. female presenting on 07/07/2017 for Rash (thighs, hydrocortisone, on & off 2-3 wks) and Vaginitis (used monistat, no help, started Sat or Sunday)  Recurrent yeast vaginally and in groin. Has had some under breast and axilla. Recently started with Keflex by her surgeon and yeast has flared up more.  Relevant past medical, surgical, family and social history reviewed and updated as indicated. Allergies and medications reviewed and updated.  Past Medical History:  Diagnosis Date  . Depression     Past Surgical History:  Procedure Laterality Date  . CLOSED REDUCTION WRIST FRACTURE Right     Review of Systems  Constitutional: Negative.   HENT: Negative.   Eyes: Negative.   Respiratory: Negative.   Gastrointestinal: Negative.   Genitourinary: Negative.   Skin: Positive for color change and rash.  Neurological: Negative for syncope.    Allergies as of 07/07/2017   No Known Allergies     Medication List       Accurate as of 07/07/17 11:59 PM. Always use your most recent med list.          ALPRAZolam 1 MG tablet Commonly known as:  XANAX TK 1/2 TO 1 T PO PRN FOR ANXIETY   busPIRone 15 MG tablet Commonly known as:  BUSPAR TAKE 1 TABLET TWICE A DAY FOR ONE WEEK THEN TAKE 2 TABLETS TWICE A DAY   cephALEXin 500 MG capsule Commonly known as:  KEFLEX Take 500 mg by mouth 4 (four) times daily.   fluconazole 150 MG tablet Commonly known as:  DIFLUCAN 1 po q week x 4 weeks   fluticasone 50 MCG/ACT nasal spray Commonly known as:  FLONASE Place 1 spray into both nostrils 2 (two) times daily as needed for allergies or rhinitis.   LATUDA PO Take 30 mg by mouth.   valACYclovir 500 MG tablet Commonly known as:  VALTREX          Objective:    BP 124/75   Pulse 99   Temp (!) 96.8 F (36 C) (Oral)   Ht 5\' 2"  (1.575 m)   Wt 251 lb (113.9 kg)   BMI 45.91 kg/m   No Known Allergies  Physical Exam  Constitutional: She is oriented to person, place, and time. She appears well-developed and well-nourished.  HENT:  Head: Normocephalic and atraumatic.  Right Ear: Tympanic membrane, external ear and ear canal normal.  Left Ear: Tympanic membrane, external ear and ear canal normal.  Nose: Nose normal. No rhinorrhea.  Mouth/Throat: Oropharynx is clear and moist and mucous membranes are normal. No oropharyngeal exudate or posterior oropharyngeal erythema.  Eyes: Pupils are equal, round, and reactive to light. Conjunctivae and EOM are normal.  Neck: Normal range of motion. Neck supple.  Cardiovascular: Normal rate, regular rhythm, normal heart sounds and intact distal pulses.   Pulmonary/Chest: Effort normal and breath sounds normal.  Abdominal: Soft. Bowel sounds are normal.  Neurological: She is alert and oriented to person, place, and time. She has normal reflexes.  Skin: Skin is warm and dry. No rash noted.  Psychiatric: She has a normal mood and affect. Her behavior is normal.  Judgment and thought content normal.  Nursing note and vitals reviewed.       Assessment & Plan:   1. Yeast infection - fluconazole (DIFLUCAN) 150 MG tablet; 1 po q week x 4 weeks  Dispense: 4 tablet; Refill: 2   Current Outpatient Prescriptions:  .  ALPRAZolam (XANAX) 1 MG tablet, TK 1/2 TO 1 T PO PRN FOR ANXIETY, Disp: , Rfl: 0 .  busPIRone (BUSPAR) 15 MG tablet, TAKE 1 TABLET TWICE A DAY FOR ONE WEEK THEN TAKE 2 TABLETS TWICE A DAY, Disp: , Rfl: 1 .  cephALEXin (KEFLEX) 500 MG capsule, Take 500 mg by mouth 4 (four) times daily., Disp: , Rfl:  .  fluticasone (FLONASE) 50 MCG/ACT nasal spray, Place 1 spray into both nostrils 2 (two) times daily as needed for allergies or rhinitis., Disp: 16 g, Rfl: 6 .  Lurasidone HCl (LATUDA  PO), Take 30 mg by mouth., Disp: , Rfl:  .  valACYclovir (VALTREX) 500 MG tablet, , Disp: , Rfl:  .  fluconazole (DIFLUCAN) 150 MG tablet, 1 po q week x 4 weeks, Disp: 4 tablet, Rfl: 2  Continue all other maintenance medications as listed above.  Follow up plan: Follow-up as needed or worsening of symptoms. Call office for any issues.  Educational handout given for survey  Remus Loffler PA-C [facility] [address]  [place], [place] [address] [phone]   07/08/2017, 12:30 PM

## 2017-10-26 LAB — OB RESULTS CONSOLE RPR: RPR: NONREACTIVE

## 2017-10-26 LAB — OB RESULTS CONSOLE RUBELLA ANTIBODY, IGM: Rubella: IMMUNE

## 2017-10-26 LAB — OB RESULTS CONSOLE ABO/RH: RH TYPE: POSITIVE

## 2017-10-26 LAB — OB RESULTS CONSOLE ANTIBODY SCREEN: ANTIBODY SCREEN: NEGATIVE

## 2017-10-26 LAB — OB RESULTS CONSOLE HIV ANTIBODY (ROUTINE TESTING): HIV: NONREACTIVE

## 2017-10-26 LAB — OB RESULTS CONSOLE HEPATITIS B SURFACE ANTIGEN: HEP B S AG: NEGATIVE

## 2017-12-22 IMAGING — DX DG WRIST 2V*R*
2 series · 2 of 2 positions shown · non-contrast
Comparison: None.

CLINICAL DATA: Fall

EXAM:
RIGHT WRIST - 2 VIEW

[wrist ap]
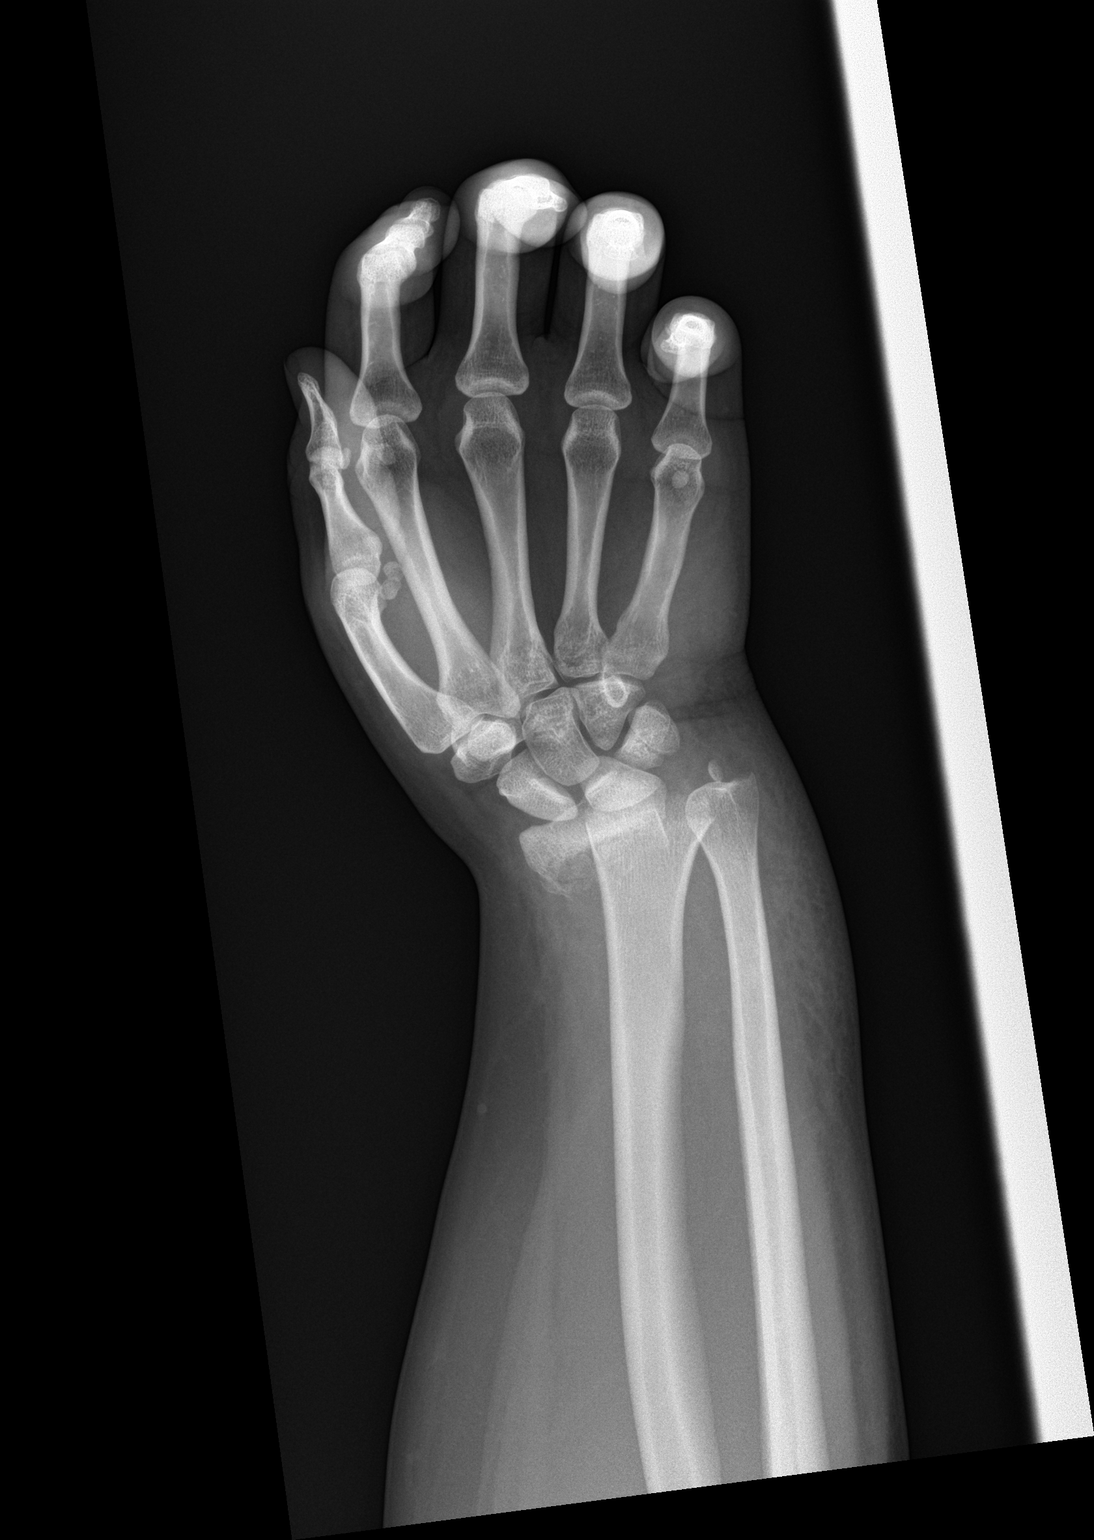

[wrist lat]
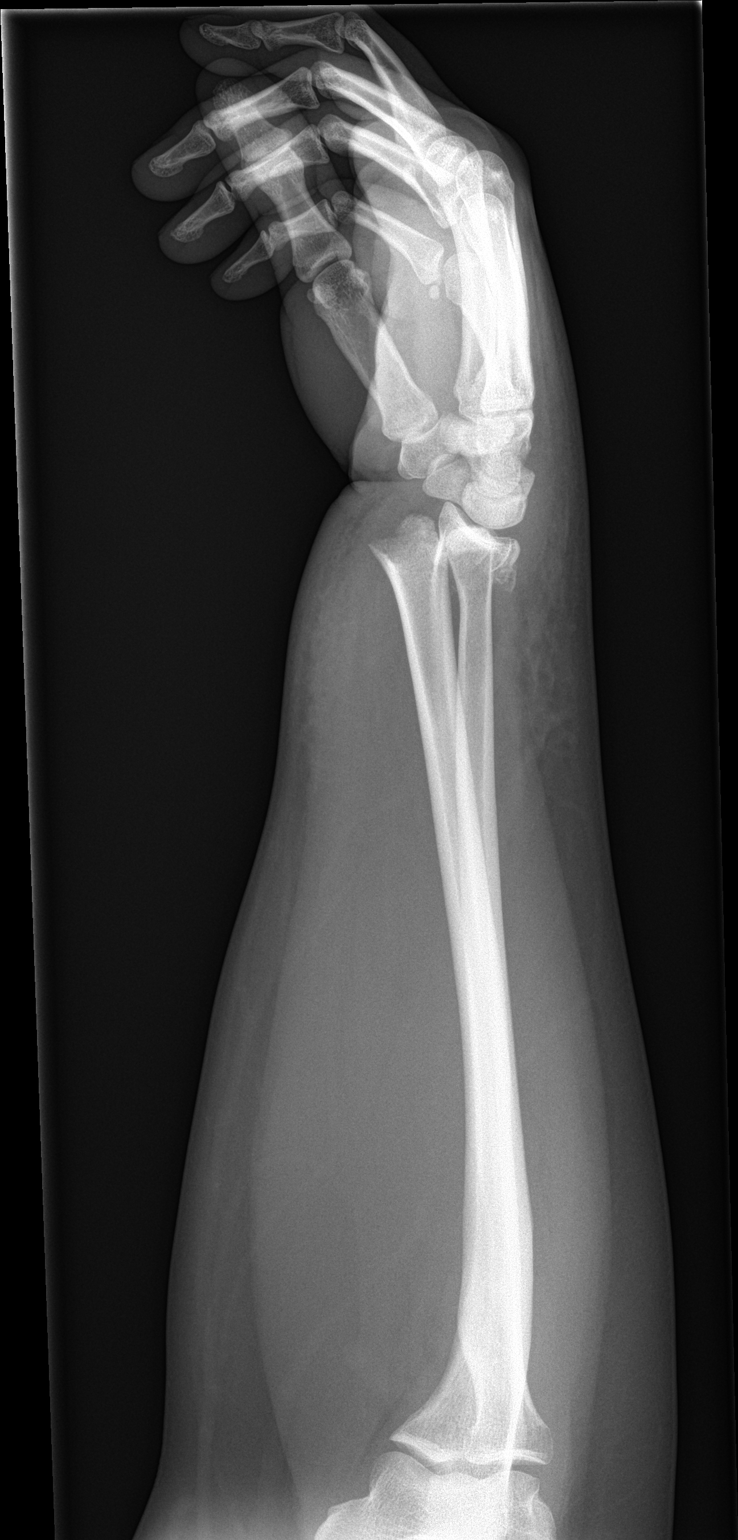

[2 of 2 positions shown; findings below may reference images not displayed]

FINDINGS: There is a distal right radial fracture. The distal fragment is
displaced posteriorly 1 shaft width and overlaps the distal radius.
Ulnar styloid fracture also noted.
IMPRESSION: Posteriorly displaced distal right radial fracture with overlapping
fracture fragments. Displaced ulnar styloid fracture.

## 2017-12-27 NOTE — L&D Delivery Note (Signed)
Patient was C/C/+2 and pushed for <5 minutes without epidural.   I was called to the room after the head delivered.  Upon arrival, McRobert's and delivery of posterior arm relieved <30sec shoulder dystocia. NSVD female infant, Apgars 8/9, weight pending.   The patient had a second degree laceration repaired with 2-0 vicryl. Fundus was firm. EBL was expected amount. Placenta was delivered intact. Vagina was clear.  Delayed cord clamping done for 30-60 seconds while warming baby. Baby was vigorous and doing skin to skin with mother.  Angelica Bond

## 2018-01-30 LAB — OB RESULTS CONSOLE GC/CHLAMYDIA
CHLAMYDIA, DNA PROBE: NEGATIVE
Gonorrhea: NEGATIVE

## 2018-05-20 ENCOUNTER — Other Ambulatory Visit: Payer: Self-pay

## 2018-05-20 ENCOUNTER — Inpatient Hospital Stay (HOSPITAL_COMMUNITY)
Admission: AD | Admit: 2018-05-20 | Discharge: 2018-05-22 | DRG: 806 | Disposition: A | Discharge status: Home / Self Care | Payer: 59 | Source: Ambulatory Visit | Attending: Obstetrics and Gynecology | Admitting: Obstetrics and Gynecology

## 2018-05-20 ENCOUNTER — Encounter (HOSPITAL_COMMUNITY): Payer: Self-pay | Admitting: *Deleted

## 2018-05-20 DIAGNOSIS — O99344 Other mental disorders complicating childbirth: Secondary | ICD-10-CM | POA: Diagnosis present

## 2018-05-20 DIAGNOSIS — F419 Anxiety disorder, unspecified: Secondary | ICD-10-CM | POA: Diagnosis present

## 2018-05-20 DIAGNOSIS — A6000 Herpesviral infection of urogenital system, unspecified: Secondary | ICD-10-CM | POA: Diagnosis present

## 2018-05-20 DIAGNOSIS — O134 Gestational [pregnancy-induced] hypertension without significant proteinuria, complicating childbirth: Secondary | ICD-10-CM | POA: Diagnosis present

## 2018-05-20 DIAGNOSIS — O9832 Other infections with a predominantly sexual mode of transmission complicating childbirth: Secondary | ICD-10-CM | POA: Diagnosis present

## 2018-05-20 DIAGNOSIS — Z3A39 39 weeks gestation of pregnancy: Secondary | ICD-10-CM

## 2018-05-20 DIAGNOSIS — F329 Major depressive disorder, single episode, unspecified: Secondary | ICD-10-CM | POA: Diagnosis present

## 2018-05-20 DIAGNOSIS — O99824 Streptococcus B carrier state complicating childbirth: Secondary | ICD-10-CM | POA: Diagnosis present

## 2018-05-20 HISTORY — DX: Obesity, unspecified: E66.9

## 2018-05-20 HISTORY — DX: Herpesviral infection, unspecified: B00.9

## 2018-05-20 HISTORY — DX: Anxiety disorder, unspecified: F41.9

## 2018-05-20 LAB — COMPREHENSIVE METABOLIC PANEL
ALBUMIN: 3 g/dL — AB (ref 3.5–5.0)
ALK PHOS: 120 U/L (ref 38–126)
ALT: 25 U/L (ref 14–54)
ALT: 30 U/L (ref 14–54)
ANION GAP: 10 (ref 5–15)
AST: 22 U/L (ref 15–41)
AST: 28 U/L (ref 15–41)
Albumin: 2.8 g/dL — ABNORMAL LOW (ref 3.5–5.0)
Alkaline Phosphatase: 119 U/L (ref 38–126)
Anion gap: 10 (ref 5–15)
BILIRUBIN TOTAL: 0.5 mg/dL (ref 0.3–1.2)
BUN: 11 mg/dL (ref 6–20)
BUN: 8 mg/dL (ref 6–20)
CALCIUM: 8.9 mg/dL (ref 8.9–10.3)
CALCIUM: 9.2 mg/dL (ref 8.9–10.3)
CHLORIDE: 102 mmol/L (ref 101–111)
CO2: 21 mmol/L — AB (ref 22–32)
CO2: 21 mmol/L — ABNORMAL LOW (ref 22–32)
CREATININE: 0.67 mg/dL (ref 0.44–1.00)
Chloride: 103 mmol/L (ref 101–111)
Creatinine, Ser: 0.72 mg/dL (ref 0.44–1.00)
GFR calc Af Amer: >60 mL/min (ref >60)
GFR calc non Af Amer: >60 mL/min (ref >60)
GLUCOSE: 85 mg/dL (ref 65–99)
Glucose, Bld: 86 mg/dL (ref 65–99)
Potassium: 4.6 mmol/L (ref 3.5–5.1)
Potassium: 4.7 mmol/L (ref 3.5–5.1)
SODIUM: 133 mmol/L — AB (ref 135–145)
Sodium: 134 mmol/L — ABNORMAL LOW (ref 135–145)
TOTAL PROTEIN: 7 g/dL (ref 6.5–8.1)
Total Bilirubin: 0.6 mg/dL (ref 0.3–1.2)
Total Protein: 6.3 g/dL — ABNORMAL LOW (ref 6.5–8.1)

## 2018-05-20 LAB — CBC
HCT: 37.4 % (ref 36.0–46.0)
HEMATOCRIT: 37.2 % (ref 36.0–46.0)
HEMOGLOBIN: 12.5 g/dL (ref 12.0–15.0)
Hemoglobin: 12.3 g/dL (ref 12.0–15.0)
MCH: 29.2 pg (ref 26.0–34.0)
MCH: 29.8 pg (ref 26.0–34.0)
MCHC: 32.9 g/dL (ref 30.0–36.0)
MCHC: 33.6 g/dL (ref 30.0–36.0)
MCV: 88.8 fL (ref 78.0–100.0)
MCV: 88.6 fL (ref 78.0–100.0)
PLATELETS: 215 10*3/uL (ref 150–400)
Platelets: 221 10*3/uL (ref 150–400)
RBC: 4.21 MIL/uL (ref 3.87–5.11)
RBC: 4.2 MIL/uL (ref 3.87–5.11)
RDW: 13.9 % (ref 11.5–15.5)
RDW: 13.7 % (ref 11.5–15.5)
WBC: 10.8 10*3/uL — ABNORMAL HIGH (ref 4.0–10.5)
WBC: 14.8 10*3/uL — ABNORMAL HIGH (ref 4.0–10.5)

## 2018-05-20 LAB — URINALYSIS, ROUTINE W REFLEX MICROSCOPIC
Bilirubin Urine: NEGATIVE
GLUCOSE, UA: NEGATIVE mg/dL
HGB URINE DIPSTICK: NEGATIVE
Ketones, ur: NEGATIVE mg/dL
LEUKOCYTES UA: NEGATIVE
Nitrite: NEGATIVE
PH: 5 (ref 5.0–8.0)
Protein, ur: NEGATIVE mg/dL
Specific Gravity, Urine: 1.016 (ref 1.005–1.030)

## 2018-05-20 LAB — PROTEIN / CREATININE RATIO, URINE
CREATININE, URINE: 105 mg/dL
PROTEIN CREATININE RATIO: 0.1 mg/mg{creat} (ref 0.00–0.15)
TOTAL PROTEIN, URINE: 10 mg/dL

## 2018-05-20 LAB — ABO/RH: ABO/RH(D): A POS

## 2018-05-20 LAB — TYPE AND SCREEN
ABO/RH(D): A POS
Antibody Screen: NEGATIVE

## 2018-05-20 MED ORDER — MAGNESIUM SULFATE 40 G IN LACTATED RINGERS - SIMPLE
2.0000 g/h | INTRAVENOUS | Status: DC
  Administered 2018-05-20: 2 g/h via INTRAVENOUS
  Filled 2018-05-20: qty 40

## 2018-05-20 MED ORDER — HYDRALAZINE HCL 20 MG/ML IJ SOLN: 10.0000 mg | Freq: Once | INTRAMUSCULAR | Status: DC | PRN

## 2018-05-20 MED ORDER — MAGNESIUM SULFATE 40 G IN LACTATED RINGERS - SIMPLE: 2.0000 g/h | INTRAVENOUS | Status: DC

## 2018-05-20 MED ORDER — OXYTOCIN 40 UNITS IN LACTATED RINGERS INFUSION - SIMPLE MED
1.0000 m[IU]/min | INTRAVENOUS | Status: DC
  Administered 2018-05-20: 2 m[IU]/min via INTRAVENOUS
  Filled 2018-05-20: qty 1000

## 2018-05-20 MED ORDER — ONDANSETRON HCL 4 MG/2ML IJ SOLN: 4.0000 mg | Freq: Four times a day (QID) | INTRAMUSCULAR | Status: DC | PRN

## 2018-05-20 MED ORDER — SOD CITRATE-CITRIC ACID 500-334 MG/5ML PO SOLN
30.0000 mL | ORAL | Status: DC | PRN
  Administered 2018-05-20: 30 mL via ORAL
  Filled 2018-05-20: qty 15

## 2018-05-20 MED ORDER — MAGNESIUM SULFATE BOLUS VIA INFUSION
4.0000 g | Freq: Once | INTRAVENOUS | Status: AC
  Administered 2018-05-20: 4 g via INTRAVENOUS
  Filled 2018-05-20: qty 500

## 2018-05-20 MED ORDER — LABETALOL HCL 5 MG/ML IV SOLN
20.0000 mg | INTRAVENOUS | Status: AC | PRN
  Administered 2018-05-20: 80 mg via INTRAVENOUS
  Administered 2018-05-20: 20 mg via INTRAVENOUS
  Administered 2018-05-20: 40 mg via INTRAVENOUS
  Filled 2018-05-20: qty 16
  Filled 2018-05-20: qty 8
  Filled 2018-05-20: qty 4

## 2018-05-20 MED ORDER — PENICILLIN G POTASSIUM 5000000 UNITS IJ SOLR
5.0000 10*6.[IU] | Freq: Once | INTRAMUSCULAR | Status: AC
  Administered 2018-05-20: 5 10*6.[IU] via INTRAVENOUS
  Filled 2018-05-20: qty 5

## 2018-05-20 MED ORDER — OXYCODONE-ACETAMINOPHEN 5-325 MG PO TABS: 1.0000 | ORAL_TABLET | ORAL | Status: DC | PRN

## 2018-05-20 MED ORDER — PENICILLIN G POT IN DEXTROSE 60000 UNIT/ML IV SOLN
3.0000 10*6.[IU] | INTRAVENOUS | Status: DC
  Administered 2018-05-20 (×3): 3 10*6.[IU] via INTRAVENOUS
  Filled 2018-05-20 (×10): qty 50

## 2018-05-20 MED ORDER — LACTATED RINGERS IV SOLN: 500.0000 mL | INTRAVENOUS | Status: DC | PRN

## 2018-05-20 MED ORDER — LACTATED RINGERS IV SOLN
INTRAVENOUS | Status: DC
  Administered 2018-05-20 (×2): via INTRAVENOUS

## 2018-05-20 MED ORDER — LIDOCAINE HCL (PF) 1 % IJ SOLN
30.0000 mL | INTRAMUSCULAR | Status: DC | PRN
  Administered 2018-05-20: 30 mL via SUBCUTANEOUS
  Filled 2018-05-20: qty 30

## 2018-05-20 MED ORDER — OXYTOCIN BOLUS FROM INFUSION
500.0000 mL | Freq: Once | INTRAVENOUS | Status: AC
  Administered 2018-05-20: 500 mL via INTRAVENOUS

## 2018-05-20 MED ORDER — FLEET ENEMA 7-19 GM/118ML RE ENEM: 1.0000 | ENEMA | RECTAL | Status: DC | PRN

## 2018-05-20 MED ORDER — TERBUTALINE SULFATE 1 MG/ML IJ SOLN: 0.2500 mg | Freq: Once | INTRAMUSCULAR | Status: DC | PRN

## 2018-05-20 MED ORDER — OXYTOCIN 40 UNITS IN LACTATED RINGERS INFUSION - SIMPLE MED: 2.5000 [IU]/h | INTRAVENOUS | Status: DC

## 2018-05-20 MED ORDER — ACETAMINOPHEN 325 MG PO TABS: 650.0000 mg | ORAL_TABLET | ORAL | Status: DC | PRN

## 2018-05-20 MED ORDER — OXYCODONE-ACETAMINOPHEN 5-325 MG PO TABS: 2.0000 | ORAL_TABLET | ORAL | Status: DC | PRN

## 2018-05-20 NOTE — MAU Note (Signed)
Ctxs since 0300. Denies LOF or bleeding. 3cm on Friday

## 2018-05-20 NOTE — MAU Provider Note (Addendum)
History     CSN: [id number]  Arrival date and time: 05/20/18 0453   First Provider Initiated Contact with Patient 05/20/18 0622      Chief Complaint  Patient presents with  . Contractions   Angelica Bond is a 27 y.o. G1P0 at [redacted]w[redacted]d who is here today for labor evaluation, and was found to be hypertensive. She denies any complications with this prgnancy and was seen on 05/19/18 in the office without any problems. Contractions started around 0300. She is rating them 5/10. She denies any VB or LOF. She reports normal fetal movement. She denies any HA, visual disturbances or RUQ pain.   Hypertension  This is a new problem. The current episode started today. The problem is unchanged. Pertinent negatives include no headaches. Associated agents: pregnancy  Past treatments include nothing.     Past Medical History:  Diagnosis Date  . Anxiety   . Depression   . Herpes   . Obesity     Past Surgical History:  Procedure Laterality Date  . CLOSED REDUCTION WRIST FRACTURE Right     Family History  Problem Relation Age of Onset  . Thyroid disease Mother   . Hypertension Mother   . Arthritis Mother   . Suicidality Father   . Healthy Brother   . Healthy Brother     Social History   Tobacco Use  . Smoking status: Never Smoker  . Smokeless tobacco: Never Used  Substance Use Topics  . Alcohol use: No  . Drug use: No    Allergies: No Known Allergies  Medications Prior to Admission  Medication Sig Dispense Refill Last Dose  . ALPRAZolam (XANAX) 1 MG tablet TK 1/2 TO 1 T PO PRN FOR ANXIETY  0 Taking  . busPIRone (BUSPAR) 15 MG tablet TAKE 1 TABLET TWICE A DAY FOR ONE WEEK THEN TAKE 2 TABLETS TWICE A DAY  1 Taking  . cephALEXin (KEFLEX) 500 MG capsule Take 500 mg by mouth 4 (four) times daily.   Taking  . fluconazole (DIFLUCAN) 150 MG tablet 1 po q week x 4 weeks 4 tablet 2   . fluticasone (FLONASE) 50 MCG/ACT nasal spray Place 1 spray into both nostrils 2 (two) times daily as  needed for allergies or rhinitis. 16 g 6 Taking  . Lurasidone HCl (LATUDA PO) Take 30 mg by mouth.   Taking  . valACYclovir (VALTREX) 500 MG tablet    Taking    Review of Systems  Constitutional: Negative for chills and fever.  Eyes: Negative for visual disturbance.  Gastrointestinal: Negative for abdominal pain.  Genitourinary: Positive for pelvic pain. Negative for vaginal bleeding and vaginal discharge.  Neurological: Negative for headaches.   Physical Exam   Blood pressure 136/68, pulse 87, temperature (!) 97.5 F (36.4 C), resp. rate 20, height  (1.575 m), weight 285 lb (129.3 kg).  Physical Exam  Nursing note and vitals reviewed. Constitutional: She is oriented to person, place, and time. She appears well-developed and well-nourished. No distress.  HENT:  Head: Normocephalic.  Cardiovascular: Normal rate.  Respiratory: Effort normal.  GI: Soft. There is no tenderness. There is no rebound.  Musculoskeletal: She exhibits edema (pitting pedal edema ).  Neurological: She is alert and oriented to person, place, and time.  Skin: Skin is warm and dry.  Psychiatric: She has a normal mood and affect.   Results for orders placed or performed during the hospital encounter of 05/20/18 (from the past 24 hour(s))  Urinalysis, Routine w reflex microscopic  Status: None   Collection Time: 05/20/18  5:10 AM  Result Value Ref Range   Color, Urine YELLOW YELLOW   APPearance CLEAR CLEAR   Specific Gravity, Urine 1.016 1.005 - 1.030   pH 5.0 5.0 - 8.0   Glucose, UA NEGATIVE NEGATIVE mg/dL   Hgb urine dipstick NEGATIVE NEGATIVE   Bilirubin Urine NEGATIVE NEGATIVE   Ketones, ur NEGATIVE NEGATIVE mg/dL   Protein, ur NEGATIVE NEGATIVE mg/dL   Nitrite NEGATIVE NEGATIVE   Leukocytes, UA NEGATIVE NEGATIVE  Protein / creatinine ratio, urine     Status: None   Collection Time: 05/20/18  5:10 AM  Result Value Ref Range   Creatinine, Urine 105.00 mg/dL   Total Protein, Urine 10 mg/dL    Protein Creatinine Ratio 0.10 0.00 - 0.15 mg/mg[Cre]  CBC     Status: Abnormal   Collection Time: 05/20/18  6:13 AM  Result Value Ref Range   WBC 10.8 (H) 4.0 - 10.5 K/uL   RBC 4.21 3.87 - 5.11 MIL/uL   Hemoglobin 12.3 12.0 - 15.0 g/dL   HCT 16.1 09.6 - 04.5 %   MCV 88.8 78.0 - 100.0 fL   MCH 29.2 26.0 - 34.0 pg   MCHC 32.9 30.0 - 36.0 g/dL   RDW 40.9 81.1 - 91.4 %   Platelets 215 150 - 400 K/uL  Comprehensive metabolic panel     Status: Abnormal   Collection Time: 05/20/18  6:13 AM  Result Value Ref Range   Sodium 133 (L) 135 - 145 mmol/L   Potassium 4.6 3.5 - 5.1 mmol/L   Chloride 102 101 - 111 mmol/L   CO2 21 (L) 22 - 32 mmol/L   Glucose, Bld 86 65 - 99 mg/dL   BUN 11 6 - 20 mg/dL   Creatinine, Ser 7.82 0.44 - 1.00 mg/dL   Calcium 8.9 8.9 - 95.6 mg/dL   Total Protein 6.3 (L) 6.5 - 8.1 g/dL   Albumin 2.8 (L) 3.5 - 5.0 g/dL   AST 22 15 - 41 U/L   ALT 25 14 - 54 U/L   Alkaline Phosphatase 119 38 - 126 U/L   Total Bilirubin 0.6 0.3 - 1.2 mg/dL   GFR calc non Af Amer >60 >60 mL/min   GFR calc Af Amer >60 >60 mL/min   Anion gap 10 5 - 15   FHT: 125, moderate with 15x15 accels, no decels Toco: approx q 4-8 min contractions  MAU Course  Procedures  MDM No cervical change in over an hour.  2130: Care turned over to [facility], CNM Thressa Sheller 8:22 AM 05/20/18   Assessment and Plan  1. Pre-e labs reviewed 2. Will recheck cervix at 9/9:15 and call Dr. Claiborne Billings about diagnosis of gestational hypertension and possible admission.   Dr. Claiborne Billings at the bedside to assume care of patient at 0930.  Luna Kitchens

## 2018-05-20 NOTE — Progress Notes (Signed)
Pt's ins. Does not cover psychiatrist so has not seen provider recently. States is doing ok for now. Discussed importance of making ob/gyn provider aware in postpartum period if anxiety/depression increases.

## 2018-05-20 NOTE — Anesthesia Pain Management Evaluation Note (Signed)
  CRNA Pain Management Visit Note  Patient: Angelica Bond, 27 y.o., female  "Hello I am a member of the anesthesia team at [facility]. We have an anesthesia team available at all times to provide care throughout the hospital, including epidural management and anesthesia for C-section. I don't know your plan for the delivery whether it a natural birth, water birth, IV sedation, nitrous supplementation, doula or epidural, but we want to meet your pain goals."   1.Was your pain managed to your expectations on prior hospitalizations?   Yes   2.What is your expectation for pain management during this hospitalization?     Epidural, IV pain meds and Nitrous Oxide  3.How can we help you reach that goal? Patient open to all options per patients nurse  Record the patient's initial score and the patient's pain goal.   Pain: Patient sleeping - unable to assess  Pain Goal: Patient sleeping - unable to assess [facility] wants you to be able to say your pain was always managed very well.  Rica Records 05/20/2018

## 2018-05-20 NOTE — Progress Notes (Signed)
CNM made aware of pt's elevated b/ps. Orders received

## 2018-05-21 ENCOUNTER — Encounter (HOSPITAL_COMMUNITY): Payer: Self-pay

## 2018-05-21 LAB — CBC
HCT: 30.1 % — ABNORMAL LOW (ref 36.0–46.0)
HEMOGLOBIN: 10 g/dL — AB (ref 12.0–15.0)
MCH: 28.9 pg (ref 26.0–34.0)
MCHC: 33.2 g/dL (ref 30.0–36.0)
MCV: 87 fL (ref 78.0–100.0)
Platelets: 220 10*3/uL (ref 150–400)
RBC: 3.46 MIL/uL — AB (ref 3.87–5.11)
RDW: 13.8 % (ref 11.5–15.5)
WBC: 18 10*3/uL — AB (ref 4.0–10.5)

## 2018-05-21 LAB — RPR: RPR: NONREACTIVE

## 2018-05-21 MED ORDER — DIPHENHYDRAMINE HCL 25 MG PO CAPS
25.0000 mg | ORAL_CAPSULE | Freq: Four times a day (QID) | ORAL | Status: DC | PRN
Start: 2018-05-21 — End: 2018-05-22

## 2018-05-21 MED ORDER — SODIUM CHLORIDE 0.9% FLUSH: 3.0000 mL | INTRAVENOUS | Status: DC | PRN

## 2018-05-21 MED ORDER — ONDANSETRON HCL 4 MG/2ML IJ SOLN: 4.0000 mg | INTRAMUSCULAR | Status: DC | PRN

## 2018-05-21 MED ORDER — BENZOCAINE-MENTHOL 20-0.5 % EX AERO
1.0000 "application " | INHALATION_SPRAY | CUTANEOUS | Status: DC | PRN
  Administered 2018-05-21: 1 via TOPICAL
  Filled 2018-05-21: qty 56

## 2018-05-21 MED ORDER — SIMETHICONE 80 MG PO CHEW
80.0000 mg | CHEWABLE_TABLET | ORAL | Status: DC | PRN
Start: 2018-05-21 — End: 2018-05-22

## 2018-05-21 MED ORDER — IBUPROFEN 600 MG PO TABS
600.0000 mg | ORAL_TABLET | Freq: Four times a day (QID) | ORAL | Status: DC
  Administered 2018-05-21 – 2018-05-22 (×6): 600 mg via ORAL
  Filled 2018-05-21 (×6): qty 1

## 2018-05-21 MED ORDER — WITCH HAZEL-GLYCERIN EX PADS: 1.0000 "application " | MEDICATED_PAD | CUTANEOUS | Status: DC | PRN

## 2018-05-21 MED ORDER — OXYCODONE-ACETAMINOPHEN 5-325 MG PO TABS
2.0000 | ORAL_TABLET | ORAL | Status: DC | PRN
  Administered 2018-05-21 – 2018-05-22 (×2): 2 via ORAL
  Filled 2018-05-21 (×2): qty 2

## 2018-05-21 MED ORDER — COCONUT OIL OIL: 1.0000 "application " | TOPICAL_OIL | Status: DC | PRN

## 2018-05-21 MED ORDER — SENNOSIDES-DOCUSATE SODIUM 8.6-50 MG PO TABS
2.0000 | ORAL_TABLET | ORAL | Status: DC
  Administered 2018-05-21 – 2018-05-22 (×2): 2 via ORAL
  Filled 2018-05-21 (×2): qty 2

## 2018-05-21 MED ORDER — OXYCODONE-ACETAMINOPHEN 5-325 MG PO TABS
1.0000 | ORAL_TABLET | ORAL | Status: DC | PRN
  Administered 2018-05-21 – 2018-05-22 (×2): 1 via ORAL
  Filled 2018-05-21 (×2): qty 1

## 2018-05-21 MED ORDER — ONDANSETRON HCL 4 MG PO TABS: 4.0000 mg | ORAL_TABLET | ORAL | Status: DC | PRN

## 2018-05-21 MED ORDER — PRENATAL MULTIVITAMIN CH
1.0000 | ORAL_TABLET | Freq: Every day | ORAL | Status: DC
  Administered 2018-05-21: 1 via ORAL
  Filled 2018-05-21: qty 1

## 2018-05-21 MED ORDER — TETANUS-DIPHTH-ACELL PERTUSSIS 5-2.5-18.5 LF-MCG/0.5 IM SUSP
0.5000 mL | Freq: Once | INTRAMUSCULAR | Status: AC
  Administered 2018-05-22: 0.5 mL via INTRAMUSCULAR
  Filled 2018-05-21: qty 0.5

## 2018-05-21 MED ORDER — DIBUCAINE 1 % RE OINT: 1.0000 "application " | TOPICAL_OINTMENT | RECTAL | Status: DC | PRN

## 2018-05-21 MED ORDER — MAGNESIUM SULFATE 40 G IN LACTATED RINGERS - SIMPLE
2.0000 g/h | INTRAVENOUS | Status: AC
  Administered 2018-05-21: 2 g/h via INTRAVENOUS
  Filled 2018-05-21: qty 500
  Filled 2018-05-21: qty 40

## 2018-05-21 MED ORDER — ACETAMINOPHEN 325 MG PO TABS: 650.0000 mg | ORAL_TABLET | ORAL | Status: DC | PRN

## 2018-05-21 MED ORDER — LACTATED RINGERS IV SOLN
INTRAVENOUS | Status: DC
  Administered 2018-05-21 (×3): via INTRAVENOUS

## 2018-05-21 MED ORDER — MAGNESIUM SULFATE 40 G IN LACTATED RINGERS - SIMPLE: 2.0000 g/h | INTRAVENOUS | Status: DC

## 2018-05-21 MED ORDER — ZOLPIDEM TARTRATE 5 MG PO TABS: 5.0000 mg | ORAL_TABLET | Freq: Every evening | ORAL | Status: DC | PRN

## 2018-05-21 NOTE — Lactation Note (Addendum)
This note was copied from a baby's chart. Lactation Consultation Note  Patient Name: Angelica Bond [id number] Date: 05/21/2018 Reason for consult: Initial assessment;Term;Primapara;1st time breastfeeding  Baby is 28 hours old  LC reviewed and updated the doc flow sheets  LC reviewed hand expressing , and mom able  To return demo.  And LC assisted with changing the baby's wet and stool diaper.  LC assisted mom to latch on the right breast / football / and baby awake and latched For 10 mins with swallows , increased with breast compressions. Per mom comfortable and  Baby released on her own and nipple well rounded.  LC recommended STS feedings, and STS when baby has not shown feedings cues in while.  Hand expressing , save milk and it can be spoon fed back to baby.  RN had set up the DEBP and per mom had pumped x 1 with small results on the flanges.  Due to mom being on MagSo4 - High B/P, and edema, LC would recommend extra post pumping both breast  When baby isn't cluster feeding.  Mother informed of post-discharge support and given phone number to the lactation department, including services for phone call assistance; out-patient appointments; and breastfeeding support group. List of other breastfeeding resources in the community given in the handout. Encouraged mother to call for problems or concerns related to breastfeeding.  Per mom has  DEBP - [employer] from insurance     Maternal Data Has patient been taught Hand Expression?: Yes(more drops from the right than left/ mom reports breast changes ) Does the patient have breastfeeding experience prior to this delivery?: No  Feeding Feeding Type: Breast Fed Length of feed: 10 min(several swallows noted )  LATCH Score Latch: Grasps breast easily, tongue down, lips flanged, rhythmical sucking.  Audible Swallowing: A few with stimulation(increased to 2 )  Type of Nipple: Everted at rest and after stimulation(compressible areolas  )  Comfort (Breast/Nipple): Soft / non-tender  Hold (Positioning): Assistance needed to correctly position infant at breast and maintain latch.  LATCH Score: 8  Interventions Interventions: Breast feeding basics reviewed;Assisted with latch;Adjust position;Skin to skin;Breast massage;Hand express;Breast compression;Support pillows;Position options;DEBP  Lactation Tools Discussed/Used Tools: Pump(was set up by the RN ) Breast pump type: Double-Electric Breast Pump WIC Program: No Pump Review: Setup, frequency, and cleaning(DEBP was set up by the RN )   Consult Status Consult Status: Follow-up Date: 05/22/18 Follow-up type: In-patient    Angelica Bond Angelica Bond 05/21/2018, 3:40 PM

## 2018-05-21 NOTE — H&P (Signed)
27 y.o. [redacted]w[redacted]d  G1P1001 comes in c/o ctx.  Otherwise has good fetal movement and no bleeding.  Found to have mild range BPs in MAU.  Denies HA, vision change or RUQ pain.  Past Medical History:  Diagnosis Date  . Anxiety   . Depression   . Herpes   . Obesity     Past Surgical History:  Procedure Laterality Date  . CLOSED REDUCTION WRIST FRACTURE Right     OB History  Gravida Para Term Preterm AB Living  SAB TAB Ectopic Multiple Live Births        0 1    # Outcome Date GA Lbr Len/2nd Weight Sex Delivery Anes PTL Lv  1 Term 05/20/18 [redacted]w[redacted]d 12:11 / 00:12 7 lb 4.9 oz (3.315 kg) F Vag-Spont None  LIV    Social History   Socioeconomic History  . Marital status: Single    Spouse name: Not on file  . Number of children: Not on file  . Years of education: Not on file  . Highest education level: Not on file  Occupational History  . Not on file  Social Needs  . Financial resource strain: Not on file  . Food insecurity:    Worry: Not on file    Inability: Not on file  . Transportation needs:    Medical: Not on file    Non-medical: Not on file  Tobacco Use  . Smoking status: Never Smoker  . Smokeless tobacco: Never Used  Substance and Sexual Activity  . Alcohol use: No  . Drug use: No  . Sexual activity: Never  Lifestyle  . Physical activity:    Days per week: Not on file    Minutes per session: Not on file  . Stress: Not on file  Relationships  . Social connections:    Talks on phone: Not on file    Gets together: Not on file    Attends religious service: Not on file    Active member of club or organization: Not on file    Attends meetings of clubs or organizations: Not on file    Relationship status: Not on file  . Intimate partner violence:    Fear of current or ex partner: Not on file    Emotionally abused: Not on file    Physically abused: Not on file    Forced sexual activity: Not on file  Other Topics Concern  . Not on file  Social History  Narrative  . Not on file   Patient has no known allergies.    Prenatal Transfer Tool  Maternal Diabetes: No Genetic Screening: Normal Maternal Ultrasounds/Referrals: Normal Fetal Ultrasounds or other Referrals:  None Maternal Substance Abuse:  No Significant Maternal Medications:  None Significant Maternal Lab Results: Lab values include: Group B Strep positive  Other PNC: uncomplicated.    Vitals:   05/21/18 0111 05/21/18 0223 05/21/18 0315 05/21/18 0425  BP: 126/67 119/68 113/63 (!) 115/48  Pulse: 97 99 91 97  Resp: (!) Temp: 97.9 F (36.6 C) 98.3 F (36.8 C)  98 F (36.7 C)  TempSrc: Oral Oral  Oral  SpO2: 99% 99%  97%  Weight:      Height:        Lungs/Cor:  NAD Abdomen:  soft, gravid Ex:  no cords, erythema See record for initial MAU eam Pt now delivered   A/P   Admitted for IOL d/t GHTN  Routine care  GBS Pos- PCN  Angelica Bond

## 2018-05-21 NOTE — Progress Notes (Signed)
CSW spoke with patient to discuss diagnosis of anxiety and depression. Patient stated that she was diagnosed many years ago and was medicated for a while, stating she was on Latuda for almost two years before stopping when she got pregnant per her doctor's request. Patient reports having a good mood since giving birth. Patient stated that she stopped seeing her psychiatrist due to it no longer being in network with her insurance company. Patient states that she is unsure if she needs medication at this time but feels comfortable and certain that she can find another one on her own. CSW and patient discussed baby blues period versus postpartum depression thoroughly. CSW encouraged patient to please reach out to OBGYN or CSW department for assistance with finding a mental health provider. CSW educated patient on how to obtain comprehensive list of providers in local area online, patient did not want a hard copy of the resource list. Patient did not have questions and expressed gratitude for CSW interaction.  Edwin Dada, MSW, LCSW-A Clinical Social Worker [facility] [phone]

## 2018-05-21 NOTE — Progress Notes (Signed)
Patient is eating, ambulating, voiding.  Pain control is good.  Appropriate lochia, no complaints.  Vitals:   05/21/18 0223 05/21/18 0315 05/21/18 0425 05/21/18 0906  BP: 119/68 113/63 (!) 115/48 (!) 103/59  Pulse: 99 91 97 90  Resp: Temp: 98.3 F (36.8 C)  98 F (36.7 C)   TempSrc: Oral  Oral   SpO2: 99%  97% 99%  Weight:      Height:        Fundus firm Ext: no calf tenderness  Lab Results  Component Value Date   WBC 18.0 (H) 05/21/2018   HGB 10.0 (L) 05/21/2018   HCT 30.1 (L) 05/21/2018   MCV 87.0 05/21/2018   PLT 220 05/21/2018    --/--/A POS Performed at [facility], [address] [place], [place] [address]  (05/25 6045)  A/P Post partum day 1. Pt's MgSO4 will be discontinued tonight at 11pm, BPs are normal  Routine care.  Expect d/c 5/27.    Philip Aspen

## 2018-05-22 MED ORDER — IBUPROFEN 600 MG PO TABS: 600.0000 mg | ORAL_TABLET | Freq: Four times a day (QID) | ORAL | 0 refills | Status: DC | PRN

## 2018-05-22 MED ORDER — DOCUSATE SODIUM 100 MG PO CAPS: 100.0000 mg | ORAL_CAPSULE | Freq: Two times a day (BID) | ORAL | 0 refills | Status: DC

## 2018-05-22 NOTE — Progress Notes (Signed)
Pt Sleeping

## 2018-05-22 NOTE — Progress Notes (Signed)
Postpartum discharge instructions given to patient.  Discussed follow up appointments, pre-eclampsia, and  postpartum care. IV removed. Paperwork signed

## 2018-05-22 NOTE — Discharge Summary (Signed)
Obstetric Discharge Summary Reason for Admission: onset of labor Prenatal Procedures: ultrasound Intrapartum Procedures: spontaneous vaginal delivery Postpartum Procedures: none Complications-Operative and Postpartum: 2nd degree perineal laceration Hemoglobin  Date Value Ref Range Status  05/21/2018 10.0 (L) 12.0 - 15.0 g/dL Final  40/98/1191 47.8 11.1 - 15.9 g/dL Final    Comment:    **Effective November 29, 2016 the reference interval**   for Hemoglobin MALES only will be changing to:                         Males 13-15 years: 12.6 - 17.7                         Males   >15 years: 13.0 - 17.7    HCT  Date Value Ref Range Status  05/21/2018 30.1 (L) 36.0 - 46.0 % Final   Hematocrit  Date Value Ref Range Status  11/17/2016 40.1 34.0 - 46.6 % Final    Physical Exam:  General: alert, cooperative and appears stated age 80: appropriate Uterine Fundus: firm Incision: healing well DVT Evaluation: No evidence of DVT seen on physical exam.  Discharge Diagnoses: Term Pregnancy-delivered, mild shoulder dystocia  Discharge Information: Date: 05/22/2018 Activity: pelvic rest Diet: routine Medications: Ibuprofen and Colace Condition: improved Instructions: refer to practice specific booklet Discharge to: home Follow-up Information    Philip Aspen, DO Follow up in 1 week(s).   Specialty:  Obstetrics and Gynecology Why:  for a BP check Contact information: [address] [place] [address] [phone]           Newborn Data: Live born female  Birth Weight: 7 lb 4.9 oz (3315 g) APGAR: 8, 9  Newborn Delivery   Time head delivered:  05/20/2018 23:08:00 Birth date/time:  05/20/2018 23:08:00 Delivery type:  Vaginal, Spontaneous     Home with mother.  Waynard Reeds 05/22/2018, 10:57 AM

## 2018-06-01 ENCOUNTER — Telehealth: Payer: Self-pay

## 2018-06-01 ENCOUNTER — Telehealth: Payer: Self-pay | Admitting: Nurse Practitioner

## 2018-06-01 ENCOUNTER — Telehealth: Payer: Self-pay | Admitting: Pediatrics

## 2018-06-01 NOTE — Telephone Encounter (Signed)
Dismissed? Tell me what to do

## 2018-06-01 NOTE — Telephone Encounter (Signed)
Post-partum, has 12do at home. Depression and anxiety well controlled during pregnancy, edinburgh 24 today at home nurse visit. In clinic with new born today for weight check. Increased stress per pt, mood has been up and down.   Feels safe at home, able to care for baby. [facility] has appt tomorrow with OB, next pediatrician f/u 4 days.

## 2018-06-01 NOTE — Telephone Encounter (Signed)
[facility] - left message.  

## 2018-06-01 NOTE — Telephone Encounter (Signed)
Per DPR left voicemail on patient's cell phone for her to check with her OB/GYN regarding increasing milk production.

## 2018-06-01 NOTE — Telephone Encounter (Signed)
PT needs to call her gyn office and follow up with them.

## 2018-06-05 ENCOUNTER — Telehealth: Payer: Self-pay

## 2018-06-05 ENCOUNTER — Telehealth: Payer: Self-pay | Admitting: Pediatrics

## 2018-06-05 ENCOUNTER — Other Ambulatory Visit: Payer: Self-pay | Admitting: Pediatrics

## 2018-06-05 ENCOUNTER — Telehealth (INDEPENDENT_AMBULATORY_CARE_PROVIDER_SITE_OTHER): Payer: 59

## 2018-06-05 DIAGNOSIS — F319 Bipolar disorder, unspecified: Secondary | ICD-10-CM

## 2018-06-05 NOTE — Telephone Encounter (Signed)
Here today with her infant for office visit.  History of bipolar disorder.  Was on Latuda prior to pregnancy.  Kasandra KnudsenLatuda was stopped during pregnancy because of insufficient evidence to know that it is safe per mom.  She had an appointment with her OB 3 days ago that she canceled because she was not sure how much she would have to pay for it and the appointment was for her laceration which has been feeling better.  Depression and anxiety symptoms have returned.  She does feel able to do what she needs to do to to help take care of herself and her baby.  She is a single mother, does not have any other support at home right now.   She was followed in the past by psychiatry, her insurance no longer covers the psychiatrist she was seeing.  She has not yet been in contact with virtual behavioral health, referral is in.  We will also refer to psychiatry for help with medication management.  Mom is trying to breast-feed.  We will see mom and infant again for next infant weight check in 3 days.
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
## 2018-06-05 NOTE — BH Specialist Note (Signed)
Error in charting.

## 2018-06-07 NOTE — Progress Notes (Signed)
Will make referral to psychiatry for earliest available slot given history of bipolar disorder.

## 2018-06-08 ENCOUNTER — Telehealth: Payer: Self-pay | Admitting: Pediatrics

## 2018-06-08 ENCOUNTER — Telehealth: Payer: Self-pay

## 2018-06-08 DIAGNOSIS — O99345 Other mental disorders complicating the puerperium: Secondary | ICD-10-CM

## 2018-06-08 DIAGNOSIS — F319 Bipolar disorder, unspecified: Secondary | ICD-10-CM

## 2018-06-08 DIAGNOSIS — F53 Postpartum depression: Secondary | ICD-10-CM

## 2018-06-08 NOTE — BH Specialist Note (Signed)
[place] [facility] Telephone Follow-up  MRN: [id number] NAME: Angelica Bond Date: 06/08/18    Total time: 15 minutes Call number: 2/6  Reason for call today:  [facility]  PHQ-9 Scores:  Dep[facility] 06/08/2018 06/05/2018 07/07/2017 11/30/2016 11/10/2016  Decreased Interest 3 3 0 0 0  Down, Depressed, Hopeless 3 3 0 0 0  PHQ - 2 Score 6 6 0 0 0  Altered sleeping 3 3 - - -  Tired, decreased energy 3 3 - - -  Change in appetite 1 2 - - -  Feeling bad or failure about yourself  2 2 - - -  Trouble concentrating 0 1 - - -  Moving slowly or fidgety/restless 0 1 - - -  Suicidal thoughts 0 0 - - -  PHQ-9 Score 15 18 - - -  Difficult doing work/chores Very difficult Extremely dIfficult - - -    GAD-7 Scores:  GAD 7 : Generalized Anxiety Score 06/08/2018 06/05/2018  Nervous, Anxious, on Edge 3 3  Control/stop worrying 3 3  Worry too much - different things 3 3  Trouble relaxing 2 2  Restless 1 1  Easily annoyed or irritable 1 1  Afraid - awful might happen 2 3  Total GAD 7 Score 15 16  Anxiety Difficulty Very difficult Very difficult    Stress Current stressors: Current Stressors: Birth of a child; Limited financial resources Sleep: Sleep: Difficulty falling asleep, Difficulty staying asleep Appetite: Appetite: Decreased Coping ability: Coping ability: Exhausted, Overwhelmed  Patient taking medications as prescribed: Patient taking medications as prescribed: No prescribed medications  Current medications: None Prescribed - stopped taking while pregnant and breast feeding.  Outpatient Encounter Medications as of 06/08/2018  Medication Sig  . docusate sodium (COLACE) 100 MG capsule Take 1 capsule (100 mg total) by mouth 2 (two) times daily.  Marland Kitchen. ibuprofen (ADVIL,MOTRIN) 600 MG tablet Take 1 tablet (600 mg total) by mouth every 6 (six) hours as needed.  . Prenatal Vit-Fe Fumarate-FA (PRENATAL MULTIVITAMIN) TABS tablet Take 1 tablet by mouth daily at 12 noon.    No facility-administered encounter medications on file as of 06/08/2018.      Self-harm Behaviors Risk Assessment Self-harm risk factors: Self-harm risk factors: (NA) Patient endorses recent thoughts of harming self: Have you recently had any thoughts about harming yourself?: No  [place] Suicide Severity Rating Scale: No flowsheet data found. C-SRSS 06/08/2018  1. Wish to be Dead No  2. Suicidal Thoughts No  6. Suicide Behavior Question No     Danger to Others Risk Assessment Danger to others risk factors: Danger to Others Risk Factors: No risk factors noted Patient endorses recent thoughts of harming others: Notification required: No need or identified person   Substance Use Assessment Patient recently consumed alcohol:  No Patient recently used drugs:  No  Alcohol Use Disorder Identification Test (AUDIT):       Edinburgh Postnatal Depression Scale - 06/08/18 1209      Edinburgh Postnatal Depression Scale:  In the Past 7 Days   I have been able to laugh and see the funny side of things.  3    I have looked forward with enjoyment to things.  3    I have blamed myself unnecessarily when things went wrong.  2    I have been anxious or worried for no good reason.  2    I have felt scared or panicky for no good reason.  2  Things have been getting on top of me.  2    I have been so unhappy that I have had difficulty sleeping.  2    I have felt sad or miserable.  0    I have been so unhappy that I have been crying.  3    The thought of harming myself has occurred to me.  0    Edinburgh Postnatal Depression Scale Total  19        Goals, Interventions and Follow-up Plan Goals: Increase healthy adjustment to current life circumstances and Increase adequate support systems for patient/family Interventions: Motivational Interviewing, Solution-Focused Strategies, Mindfulness or Management consultant, Mining engineer, Brief CBT, Supportive Counseling and Link to [employer] Follow-up Plan: 1.  Refer to Psychistrist    2..  Appt set with Dr. Vanetta Shawl on 06-16-2018.  3.  Patientn reports that she wants to follow up with her EAP [employer] for therapy      Summary:   Patient reports that she is doing well and her level of depression has improved a little.  Patient reports that she would prefer to utilize her counseling EAP program at work as opposed to the counseling resources specializing in post partum depression.   Ranae Plumber schedued a pysychiatry appointment with Dr. Vanetta Shawl on 06-16-2018.  Writer informed the patient that if she needs emergency assistance then she can contact National Suicide Prevention Lifeline 1-800/273-TALK (8255); www.suicidepreventionlifeline.Shirlyn Goltz, Massimiliano Rohleder LaVerne, LCAS-A

## 2018-06-08 NOTE — Telephone Encounter (Signed)
Here today with her daughter for daughter's appt. Reports mood has been stable from last visit. [facility] per documentation will try to get her appt to be seen with psychiatry soon.   Baby's weight improving.  Mom has our office phone number, [facility] number, crisis numbers. Walked to appt today which mom said was nice to be outside.
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
## 2018-06-14 ENCOUNTER — Telehealth: Payer: Self-pay

## 2018-06-14 NOTE — Telephone Encounter (Signed)
[facility] - Writer spoke to then patient and informed her that Dr. Hisada does take her insurance. Writer encouraged patient to contact her insurance company in order to verify that seeing Dr. Hisada is covered under her plan.   [facility] - Writer spoke to then patient and informed her that Dr. Vanetta Shawl does take her insurance.  Writer encouraged patient to contact her insurance company in order to verify that seeing Dr. Vanetta Shawl is covered under her plan.

## 2018-06-14 NOTE — Telephone Encounter (Signed)
[facility] - Writer spoke to then patient and informed her that Dr. Vanetta ShawlHisada does take her insurance. Writer encouraged patient to contact her insurance company in order to verify that seeing Dr. Vanetta ShawlHisada is covered under her plan.

## 2018-06-16 ENCOUNTER — Ambulatory Visit (INDEPENDENT_AMBULATORY_CARE_PROVIDER_SITE_OTHER): Payer: PRIVATE HEALTH INSURANCE | Admitting: Psychiatry

## 2018-06-16 ENCOUNTER — Encounter (HOSPITAL_COMMUNITY): Payer: Self-pay | Admitting: Psychiatry

## 2018-06-16 VITALS — BP 114/86 | HR 78 | Ht 62.0 in | Wt 262.0 lb

## 2018-06-16 DIAGNOSIS — F0630 Mood disorder due to known physiological condition, unspecified: Secondary | ICD-10-CM | POA: Diagnosis not present

## 2018-06-16 NOTE — Patient Instructions (Signed)
1. Continue fluoxetine 20 mg daily  2. Return to clinic in one month for 30 mins 3. Would recommend to reinitiate latuda, or quetiapine, risperidone in the future 4. CONTACT INFORMATION  What to do if you need to get in touch with someone regarding a psychiatric issue:  1. EMERGENCY: For psychiatric emergencies (if you are suicidal or if there are any other safety issues) call 911 and/or go to your nearest Emergency Room immediately.   2. IF YOU NEED SOMEONE TO TALK TO RIGHT NOW: Given my clinical responsibilities, I may not be able to speak with you over the phone for a prolonged period of time.  a. You may always call The National Suicide Prevention Lifeline at 1-800-273-TALK 7200319227(8255).  b. Your county of residence will also have local crisis services. [place]: [facility] at [phone]

## 2018-06-23 ENCOUNTER — Telehealth: Payer: Self-pay | Admitting: Clinical

## 2018-06-23 NOTE — Telephone Encounter (Signed)
I reviewed patient follow up with [facility]. I concur with the treatment plan as documented in the [facility].  PHQ-9 Results PHQ-9 Depression Screening Tool 06/23/2018  Decreased Interest 2  Down, Depressed, Hopeless 3  Altered sleeping 2  Tired, decreased energy 3  Change in appetite 3  Feeling bad or failure about yourself 3  Trouble concentrating 2  Moving slowly or fidgety/restless 0  Suicidal thoughts 2  PHQ-9 Score 20   Plan - follow up in 1 week  Terel Bann P. Mayford KnifeWilliams, MSW, LCSW Lead Behavioral Health Clinician

## 2018-06-23 NOTE — Telephone Encounter (Addendum)
[facility]  MRN: [id number] NAME: Angelica SlotBriana Bond Date: @TODAY @  Start time: 12:54PM End time: 1:04PM Total time: 10 minutes   Type of Contact: Follow up Call  Current concerns/stressors: Child support from baby's father   Screens/Assessment Tools:  PHQ-9 & GAD-7 Assessments: This is an evidence based assessment tool for depression and anxiety symptoms in adolescents and adults.  Score cut-off points for each section are as follows: 5-9: Mild, 10-14: Moderate, 15+: Severe  PHQ-9 for Depression = 20 GAD-7 for Anxiety (Not given)   How difficult have these problems made it for you to do your work, take care of things at home, or get along with other people? Somewhat difficult   Functional Assessment:  Sleep: Okay.  Appetite: Okay. Coping ability: Fair  Patient taking medications as prescribed: Patient has been taking Prozac for two weeks and has not seen any changes from taking it. She did not report on any side effects.  Current medications:  Outpatient Encounter Medications as of 06/23/2018  Medication Sig   docusate sodium (COLACE) 100 MG capsule Take 1 capsule (100 mg total) by mouth 2 (two) times daily. (Patient not taking: Reported on 06/16/2018)   FLUoxetine (PROZAC) 20 MG capsule Take 20 mg by mouth daily.   ibuprofen (ADVIL,MOTRIN) 600 MG tablet Take 1 tablet (600 mg total) by mouth every 6 (six) hours as needed.   Prenatal Vit-Fe Fumarate-FA (PRENATAL MULTIVITAMIN) TABS tablet Take 1 tablet by mouth daily at 12 noon.   valACYclovir (VALTREX) 500 MG tablet Take 500 mg by mouth 2 (two) times daily.   No facility-administered encounter medications on file as of 06/23/2018.     Self-harm and/or Suicidal Behaviors Risk Assessment Self-harm risk factors: Passive suicidal ideation more than half of the days of the week. No plan and no means.  Patient endorses recent self injurious thoughts and/or behaviors: Yes   Suicide ideations:  Suicidal ideation   Danger to Others Risk Assessment Danger to others risk factors: None noted.  Patient endorses recent thoughts of harming others: No    Substance Use Assessment Patient recently consumed alcohol: No  Patient recently used drugs: No  Patient is concerned about dependence or abuse of substances: No    Goals, Interventions and Follow-up Plan Goals: Increase healthy adjustment to current life circumstances Interventions: Solution-Focused Strategies, Supportive Counseling and Medication Monitoring   Summary of Clinical Assessment Patient reported that she is experiencing stress related to taking care of her baby. She is also feeling stressed out as she is trying to work out child support with her child's father. Patient reported she tried painting as a form of self care, but it only lasted a short time. Patient was uninterested in brainstorming other self care strategies with the intern.  Patient reported having passive suicidal thoughts frequently, but having no plan or means to carry them out. Patient reported that her mom and baby keep her from killing herself. Patient does not feel comfortable talking to others when she having suicidal thoughts and prefers to cope by herself. Intern asked patient if she wanted crisis resources, patient reported that she has these resources and is unlikely to use them.  Follow-up Plan: Continue following up with [facility].   Luvenia StarchSudheera Ranaweera, M.A.  Behavioral Health Intern

## 2018-06-27 ENCOUNTER — Telehealth: Payer: Self-pay

## 2018-06-27 NOTE — Telephone Encounter (Signed)
Patient completed her appt with Dr. Vanetta ShawlHisada on 06-16-2018.  Patient is not placed on the inactive list.    Write routed this information to the PCP and Dr. Vanetta ShawlHisada.

## 2018-07-15 NOTE — Progress Notes (Deleted)
BH MD/PA/NP OP Progress Note  07/15/2018 12:56 PM Angelica Bond  MRN:  [id number]  Chief Complaint:  HPI: *** Visit Diagnosis: No diagnosis found.  Past Psychiatric History: Please see initial evaluation for full details. I have reviewed the history. No updates at this time.     Past Medical History:  Past Medical History:  Diagnosis Date  . Anxiety   . Depression   . Herpes   . Obesity     Past Surgical History:  Procedure Laterality Date  . CLOSED REDUCTION WRIST FRACTURE Right     Family Psychiatric History: Please see initial evaluation for full details. I have reviewed the history. No updates at this time.     Family History:  Family History  Problem Relation Age of Onset  . Thyroid disease Mother   . Hypertension Mother   . Arthritis Mother   . Anxiety disorder Mother   . Suicidality Father   . Bipolar disorder Father   . Drug abuse Father   . Healthy Brother   . Healthy Brother     Social History:  Social History   Socioeconomic History  . Marital status: Single    Spouse name: Not on file  . Number of children: Not on file  . Years of education: Not on file  . Highest education level: Not on file  Occupational History  . Not on file  Social Needs  . Financial resource strain: Not on file  . Food insecurity:    Worry: Not on file    Inability: Not on file  . Transportation needs:    Medical: Not on file    Non-medical: Not on file  Tobacco Use  . Smoking status: Never Smoker  . Smokeless tobacco: Never Used  Substance and Sexual Activity  . Alcohol use: Yes    Alcohol/week: 1.8 oz    Types: 2 Cans of beer, 1 Shots of liquor per week  . Drug use: No  . Sexual activity: Not Currently  Lifestyle  . Physical activity:    Days per week: Not on file    Minutes per session: Not on file  . Stress: Not on file  Relationships  . Social connections:    Talks on phone: Not on file    Gets together: Not on file    Attends religious service: Not  on file    Active member of club or organization: Not on file    Attends meetings of clubs or organizations: Not on file    Relationship status: Not on file  Other Topics Concern  . Not on file  Social History Narrative  . Not on file    Allergies: No Known Allergies  Metabolic Disorder Labs: No results found for: HGBA1C, MPG No results found for: PROLACTIN No results found for: CHOL, TRIG, HDL, CHOLHDL, VLDL, LDLCALC No results found for: TSH  Therapeutic Level Labs: No results found for: LITHIUM No results found for: VALPROATE No components found for:  CBMZ  Current Medications: Current Outpatient Medications  Medication Sig Dispense Refill  . docusate sodium (COLACE) 100 MG capsule Take 1 capsule (100 mg total) by mouth 2 (two) times daily. (Patient not taking: Reported on 06/16/2018) 60 capsule 0  . FLUoxetine (PROZAC) 20 MG capsule Take 20 mg by mouth daily.  4  . ibuprofen (ADVIL,MOTRIN) 600 MG tablet Take 1 tablet (600 mg total) by mouth every 6 (six) hours as needed. 90 tablet 0  . Prenatal Vit-Fe Fumarate-FA (PRENATAL MULTIVITAMIN) TABS tablet  Take 1 tablet by mouth daily at 12 noon.    . valACYclovir (VALTREX) 500 MG tablet Take 500 mg by mouth 2 (two) times daily.  3   No current facility-administered medications for this visit.      Musculoskeletal: Strength & Muscle Tone: within normal limits Gait & Station: normal Patient leans: N/A  Psychiatric Specialty Exam: ROS  unknown if currently breastfeeding.There is no height or weight on file to calculate BMI.  General Appearance: Fairly Groomed  Eye Contact:  Good  Speech:  Clear and Coherent  Volume:  Normal  Mood:  {BHH MOOD:22306}  Affect:  {Affect (PAA):22687}  Thought Process:  Coherent  Orientation:  Full (Time, Place, and Person)  Thought Content: Logical   Suicidal Thoughts:  {ST/HT (PAA):22692}  Homicidal Thoughts:  {ST/HT (PAA):22692}  Memory:  Immediate;   Good  Judgement:  {Judgement  (PAA):22694}  Insight:  {Insight (PAA):22695}  Psychomotor Activity:  Normal  Concentration:  Concentration: Good and Attention Span: Good  Recall:  Good  Fund of Knowledge: Good  Language: Good  Akathisia:  No  Handed:  Right  AIMS (if indicated): not done  Assets:  Communication Skills Desire for Improvement  ADL's:  Intact  Cognition: WNL  Sleep:  {BHH GOOD/FAIR/POOR:22877}   Screenings: GAD-7     Virtual [facility] Phone Follow Up from 06/08/2018 in [facility] Phone Follow Up from 06/05/2018 in [facility]  Total GAD-7 Score  15  16    PHQ2-9     Telephone from 06/23/2018 in [facility] Phone Follow Up from 06/08/2018 in [facility] Phone Follow Up from 06/05/2018 in [facility] Office Visit from 07/07/2017 in [facility] Office Visit from 11/30/2016 in [facility]  PHQ-2 Total Score  5  6  6   0  0  PHQ-9 Total Score  20  15  18   -  -       Assessment and Plan:  Angelica Bond is a 27 y.o. year old female with a history of mood disorder, breast feeding , who presents for follow up appointment for No diagnosis found.  # Mood disorder, unspecified # r/o bipolar II disorder Patient endorses worsening neurovegetative symptoms after delivery of her daughter.  Psychosocial stressors including being a single parent, and discordance with her mother. Will continue fluoxetine at this time given it was started one week ago. Discussed with patient regarding potential risk of mania. Will consider adding antipsychotics at the next visit. Noted that although the benefit of reinitiating latuda outweighs risk to baby through breast feeding as adjunctive treatment for depression and also for mood dysregulation given her past good response, her OBGyn provider recommended against it due to its limited date per patient  report. Will consider  quetiapine or risperidone while monitoring metabolic side effect (she would discuss this with her ObGyn.). She denies any harm to her baby and denies any violent intrusive thoughts. No psychotic features seen on today's evaluation.   Plan 1. Continue fluoxetine 20 mg daily  2. Return to clinic in one month for 30 mins 3. Would recommend to reinitiate latuda, or quetiapine, risperidone in the future 4. Referral to therapy 5. Emergency resources which includes 911, ED, suicide crisis line [phone]) are discussed.   The patient demonstrates the following risk factors for suicide: Chronic risk factors for suicide include: psychiatric disorder of depression, substance use disorder and previous  suicide attempts of overdosing, hanging herself. Acute risk factors for suicide include: social withdrawal/isolation. Protective factors for this patient include: responsibility to others (children, family) and hope for the future. Considering these factors, the overall suicide risk at this point appears to be low. Patient is appropriate for outpatient follow up. She denies gun access at home.      Neysa Hottereina Maisee Vollman, MD 07/15/2018, 12:56 PM

## 2018-07-19 ENCOUNTER — Ambulatory Visit (INDEPENDENT_AMBULATORY_CARE_PROVIDER_SITE_OTHER): Payer: PRIVATE HEALTH INSURANCE | Admitting: Psychiatry

## 2018-07-19 ENCOUNTER — Encounter (HOSPITAL_COMMUNITY): Payer: Self-pay | Admitting: Psychiatry

## 2018-07-19 VITALS — BP 138/83 | HR 64 | Ht 62.0 in | Wt 262.0 lb

## 2018-07-19 DIAGNOSIS — R45851 Suicidal ideations: Secondary | ICD-10-CM | POA: Diagnosis not present

## 2018-07-19 DIAGNOSIS — R450 Nervousness: Secondary | ICD-10-CM | POA: Diagnosis not present

## 2018-07-19 DIAGNOSIS — Z813 Family history of other psychoactive substance abuse and dependence: Secondary | ICD-10-CM

## 2018-07-19 DIAGNOSIS — Z818 Family history of other mental and behavioral disorders: Secondary | ICD-10-CM | POA: Diagnosis not present

## 2018-07-19 DIAGNOSIS — F0630 Mood disorder due to known physiological condition, unspecified: Secondary | ICD-10-CM | POA: Diagnosis not present

## 2018-07-19 MED ORDER — LURASIDONE HCL 20 MG PO TABS: 20.0000 mg | ORAL_TABLET | Freq: Every day | ORAL | 0 refills | Status: DC

## 2018-07-19 NOTE — Progress Notes (Signed)
BH MD/PA/NP OP Progress Note  07/19/2018 12:04 PM Angelica Bond  MRN:  [id number]  Chief Complaint:  HPI:  Patient presents for follow-up appointment for mood disorder.  She states that although she was doing relatively better after started on fluoxetine, she feels more depressed and anxious over the past week. She is isolative and does not want to talk with anybody.  She also feels scared; she describes it as she almost has panic attack.  She is irritable.  She wishes that her baby to be quiet.  She occasionally yells at her baby when she cries; she feels bad about it. She denies any harm to the baby. She has been able to feed baby with formula/breastfeeding. She describes her relationship with her mother as "weird." She feels that her mother may not want to take care of the baby, although her mother would do it when the patient asks. She tries to do most of the things on her own and feels overwhelmed. She has passive SI at times, although she denies any intent/plan, stating that she knows how it affect people after her father died by suicide. She denies insomnia. She feels fatigue. She denies Hi, AH, VH. She denies decreased need for sleep, euphoria. She may drink some at times (some beverage from [employer]). She denies drug use.    Wt Readings from Last 3 Encounters:  07/19/18 262 lb (118.8 kg)  06/16/18 262 lb (118.8 kg)  05/20/18 285 lb (129.3 kg)    Visit Diagnosis:    ICD-10-CM   1. Mood disorder in conditions classified elsewhere F06.30     Past Psychiatric History: Please see initial evaluation for full details. I have reviewed the history. No updates at this time.     Past Medical History:  Past Medical History:  Diagnosis Date  . Anxiety   . Depression   . Herpes   . Obesity     Past Surgical History:  Procedure Laterality Date  . CLOSED REDUCTION WRIST FRACTURE Right     Family Psychiatric History: Please see initial evaluation for full details. I have reviewed the  history. No updates at this time.     Family History:  Family History  Problem Relation Age of Onset  . Thyroid disease Mother   . Hypertension Mother   . Arthritis Mother   . Anxiety disorder Mother   . Suicidality Father   . Bipolar disorder Father   . Drug abuse Father   . Healthy Brother   . Healthy Brother     Social History:  Social History   Socioeconomic History  . Marital status: Single    Spouse name: Not on file  . Number of children: Not on file  . Years of education: Not on file  . Highest education level: Not on file  Occupational History  . Not on file  Social Needs  . Financial resource strain: Not on file  . Food insecurity:    Worry: Not on file    Inability: Not on file  . Transportation needs:    Medical: Not on file    Non-medical: Not on file  Tobacco Use  . Smoking status: Never Smoker  . Smokeless tobacco: Never Used  Substance and Sexual Activity  . Alcohol use: Yes    Alcohol/week: 1.8 oz    Types: 2 Cans of beer, 1 Shots of liquor per week  . Drug use: No  . Sexual activity: Not Currently  Lifestyle  . Physical activity:  Days per week: Not on file    Minutes per session: Not on file  . Stress: Not on file  Relationships  . Social connections:    Talks on phone: Not on file    Gets together: Not on file    Attends religious service: Not on file    Active member of club or organization: Not on file    Attends meetings of clubs or organizations: Not on file    Relationship status: Not on file  Other Topics Concern  . Not on file  Social History Narrative  . Not on file    Allergies: No Known Allergies  Metabolic Disorder Labs: No results found for: HGBA1C, MPG No results found for: PROLACTIN No results found for: CHOL, TRIG, HDL, CHOLHDL, VLDL, LDLCALC No results found for: TSH  Therapeutic Level Labs: No results found for: LITHIUM No results found for: VALPROATE No components found for:  CBMZ  Current  Medications: Current Outpatient Medications  Medication Sig Dispense Refill  . docusate sodium (COLACE) 100 MG capsule Take 1 capsule (100 mg total) by mouth 2 (two) times daily. 60 capsule 0  . FLUoxetine (PROZAC) 20 MG capsule Take 20 mg by mouth daily.  4  . ibuprofen (ADVIL,MOTRIN) 600 MG tablet Take 1 tablet (600 mg total) by mouth every 6 (six) hours as needed. 90 tablet 0  . Prenatal Vit-Fe Fumarate-FA (PRENATAL MULTIVITAMIN) TABS tablet Take 1 tablet by mouth daily at 12 noon.    . valACYclovir (VALTREX) 500 MG tablet Take 500 mg by mouth 2 (two) times daily.  3  . lurasidone (LATUDA) 20 MG TABS tablet Take 1 tablet (20 mg total) by mouth daily. 30 tablet 0   No current facility-administered medications for this visit.      Musculoskeletal: Strength & Muscle Tone: within normal limits Gait & Station: normal Patient leans: N/A  Psychiatric Specialty Exam: Review of Systems  Psychiatric/Behavioral: Positive for depression and suicidal ideas. Negative for hallucinations, memory loss and substance abuse. The patient is nervous/anxious. The patient does not have insomnia.   All other systems reviewed and are negative.   Blood pressure 138/83, pulse 64, height 5\' 2"  (1.575 m), weight 262 lb (118.8 kg), SpO2 98 %, unknown if currently breastfeeding.Body mass index is 47.92 kg/m.  General Appearance: Fairly Groomed  Eye Contact:  Good  Speech:  Clear and Coherent  Volume:  Normal  Mood:  Depressed  Affect:  Appropriate, Congruent and down  Thought Process:  Coherent  Orientation:  Full (Time, Place, and Person)  Thought Content: Logical   Suicidal Thoughts:  Yes.  without intent/plan  Homicidal Thoughts:  No  Memory:  Immediate;   Good  Judgement:  Good  Insight:  Fair  Psychomotor Activity:  Normal  Concentration:  Concentration: NA and Attention Span: Good  Recall:  Good  Fund of Knowledge: Good  Language: Good  Akathisia:  No  Handed:  Right  AIMS (if indicated): not  done  Assets:  Communication Skills Desire for Improvement  ADL's:  Intact  Cognition: WNL  Sleep:  Good   Screenings: GAD-7     Virtual BH Phone Follow Up from 06/08/2018 in [facility] Virtual [facility] Phone Follow Up from 06/05/2018 in [facility]  Total GAD-7 Score  15  16    PHQ2-9     Telephone from 06/23/2018 in [facility] Virtual [facility] Phone Follow Up from 06/08/2018 in [facility] Phone  [facility]/2017 [facility]30[facility]2016 [facility]  PHQ-2 Total Score  5  6  6   0  0  PHQ-9 Total Score  20  15  18   -  -       Assessment and Plan:  Angelica SlotBriana Bond is a 27 y.o. year old female with a history of bipolar disorder, breast feeding , who presents for follow up appointment for Mood disorder in conditions classified elsewhere  # Mood disorder, unspecified # r/o MDD # r/o bipolar II disorder Patient reports slightly worsening in neurovegetative symptoms over the past week, although she did respond well to fluoxetine. Psychosocial stressors including taking care of her baby, and her father completed suicide in 2004. Will continue fluoxetine at the current dose to target depression. Will start latuda for mood dysregulation; discussed with the patient that this medication has limited data in regards to potential risk to a baby with breastfeeding. However, the benefit outweighs risk given her severity of symptoms. Discussed alternative option such as quetiapine, although it has more metabolic side effect. She is willing to try latuda. Although she will greatly benefit from CBT, she is not interested in this option due to her previous experience. Will continue to discuss as needed.   Plan 1. Continue fluoxetine 20 mg daily  2. Start latuda 20 mg daily  3.  Return to clinic in one month for 30 mins 4. Emergency resources which includes 911, ED, suicide crisis line [phone]) are discussed.   I have reviewed suicide assessment in detail. No change in the following assessment.   The patient demonstrates the following risk factors for suicide: Chronic risk factors for suicide include: psychiatric disorder of depression, substance use disorder and previous suicide attempts of overdosing, hanging herself. Acute risk factors for suicide include: social withdrawal/isolation. Protective factors for this patient include: responsibility to others (children, family) and hope for the future. Considering these factors, the overall suicide risk at this point appears to be low. Patient is appropriate for outpatient follow up. She denies gun access at home.   The duration of this appointment visit was 30 minutes of face-to-face time with the patient.  Greater than 50% of this time was spent in counseling, explanation of  diagnosis, planning of further management, and coordination of care.  Neysa Hottereina Mahamed Zalewski, MD 07/19/2018, 12:04 PM

## 2018-07-19 NOTE — Patient Instructions (Signed)
1. Continue fluoxetine 20 mg daily  2. Start latuda 20 mg daily  3. Return to clinic in one month for 30 mins 4. CONTACT INFORMATION  What to do if you need to get in touch with someone regarding a psychiatric issue:  1. EMERGENCY: For psychiatric emergencies (if you are suicidal or if there are any other safety issues) call 911 and/or go to your nearest Emergency Room immediately.   2. IF YOU NEED SOMEONE TO TALK TO RIGHT NOW: Given my clinical responsibilities, I may not be able to speak with you over the phone for a prolonged period of time.  a. You may always call The National Suicide Prevention Lifeline at 1-800-273-TALK 781-540-3970(8255).  b. Your county of residence will also have local crisis services. [place]: [facility] at (325)063-8559360-525-5499 (24 Hour Crisis Hotline)

## 2018-07-20 ENCOUNTER — Ambulatory Visit: Payer: 59 | Admitting: Family Medicine

## 2018-07-20 ENCOUNTER — Ambulatory Visit (HOSPITAL_COMMUNITY): Payer: PRIVATE HEALTH INSURANCE | Admitting: Psychiatry

## 2018-07-26 ENCOUNTER — Telehealth: Payer: Self-pay | Admitting: Pediatrics

## 2018-07-26 DIAGNOSIS — L03116 Cellulitis of left lower limb: Secondary | ICD-10-CM

## 2018-07-26 MED ORDER — CLINDAMYCIN HCL 300 MG PO CAPS: 300.0000 mg | ORAL_CAPSULE | Freq: Three times a day (TID) | ORAL | 0 refills | Status: DC

## 2018-07-26 NOTE — Telephone Encounter (Signed)
Here today with her daughter for a well-child check.  She is having 1 to 2 cm red nodules, usually either between her legs or on her side.  She was seen by her OB/GYN, questioned MRSA infections.  They often come to ahead, she is squeezes them to pop them and then they start to improve.  She has not had these before the last few weeks.  She has had about 5.  Physical exam: Approximately 1.5 cm area of induration left upper medial leg, surrounding erythema.  Mildly tender to palpation.  No fluctuance.  We will treat with clindamycin.  Return precautions discussed.  Could try chlorhexidine baths weekly to diminish possible MRSA on skin.

## 2018-07-31 ENCOUNTER — Telehealth (HOSPITAL_COMMUNITY): Payer: Self-pay | Admitting: Psychiatry

## 2018-07-31 NOTE — Telephone Encounter (Signed)
I do not recall any conversation about her FMLA paperwork at the last visit. Will wait the form to be faxed to the office.

## 2018-07-31 NOTE — Telephone Encounter (Signed)
Patient called on Thursday & asked if her FMLA could be filled out with Dr Hisada vs he OB-GYN provider. Zach asked her if we were seeing her for Postpartum? Her then told her that her OB-GYN would need to be the one that filled out her Forms.  Patient called earlier this AM I spoke with her & when informed that a $29.00 cost would be collected for any paperwork . She hung up on me. In the meantime patient calls back to front office asking for the provider to call her.  Dr Hisada asked me to call the patient.  Well once again I informed patient about the  $29.00 charge for paperwork. She asked if we had a contingency for paper work? I followed up with Donna H.  On this & told patient no.  She's upset that she called on Thursday & now her deadline for paper work to be submitted for FMLA is today. And that she doesn't fill as though she she should have to pay . I informed her there is a 72 hr response to have paper work filled out by providers  & that a cost of  $29.00 is due upon receiving any paper work  & that a R.O.I. Is required before any information is shared with other's.     

## 2018-07-31 NOTE — Telephone Encounter (Signed)
Could you contact the patient? Thanks.

## 2018-07-31 NOTE — Telephone Encounter (Signed)
Patient called on Thursday & asked if her FMLA could be filled out with Dr Vanetta ShawlHisada vs he OB-GYN provider. Zach asked her if we were seeing her for Postpartum? Her then told her that her OB-GYN would need to be the one that filled out her Forms.  Patient called earlier this AM I spoke with her & when informed that a $29.00 cost would be collected for any paperwork . She hung up on me. In the meantime patient calls back to front office asking for the provider to call her.  Dr Vanetta ShawlHisada asked me to call the patient.  Well once again I informed patient about the  $29.00 charge for paperwork. She asked if we had a contingency for paper work? I followed up with Aura Campsonna H.  On this & told patient no.  She's upset that she called on Thursday & now her deadline for paper work to be submitted for FMLA is today. And that she doesn't fill as though she she should have to pay . I informed her there is a 72 hr response to have paper work filled out by providers  & that a cost of  $29.00 is due upon receiving any paper work  & that a R.O.I. Is required before any information is shared with other's.

## 2018-08-23 NOTE — Progress Notes (Deleted)
BH MD/PA/NP OP Progress Note  08/23/2018 4:42 PM Angelica Bond  MRN:  [id number]  Chief Complaint:  HPI:   Question about FMLA  Visit Diagnosis: No diagnosis found.  Past Psychiatric History: Please see initial evaluation for full details. I have reviewed the history. No updates at this time.     Past Medical History:  Past Medical History:  Diagnosis Date  . Anxiety   . Depression   . Herpes   . Obesity     Past Surgical History:  Procedure Laterality Date  . CLOSED REDUCTION WRIST FRACTURE Right     Family Psychiatric History: Please see initial evaluation for full details. I have reviewed the history. No updates at this time.     Family History:  Family History  Problem Relation Age of Onset  . Thyroid disease Mother   . Hypertension Mother   . Arthritis Mother   . Anxiety disorder Mother   . Suicidality Father   . Bipolar disorder Father   . Drug abuse Father   . Healthy Brother   . Healthy Brother     Social History:  Social History   Socioeconomic History  . Marital status: Single    Spouse name: Not on file  . Number of children: Not on file  . Years of education: Not on file  . Highest education level: Not on file  Occupational History  . Not on file  Social Needs  . Financial resource strain: Not on file  . Food insecurity:    Worry: Not on file    Inability: Not on file  . Transportation needs:    Medical: Not on file    Non-medical: Not on file  Tobacco Use  . Smoking status: Never Smoker  . Smokeless tobacco: Never Used  Substance and Sexual Activity  . Alcohol use: Yes    Alcohol/week: 3.0 standard drinks    Types: 2 Cans of beer, 1 Shots of liquor per week  . Drug use: No  . Sexual activity: Not Currently  Lifestyle  . Physical activity:    Days per week: Not on file    Minutes per session: Not on file  . Stress: Not on file  Relationships  . Social connections:    Talks on phone: Not on file    Gets together: Not on file     Attends religious service: Not on file    Active member of club or organization: Not on file    Attends meetings of clubs or organizations: Not on file    Relationship status: Not on file  Other Topics Concern  . Not on file  Social History Narrative  . Not on file    Allergies: No Known Allergies  Metabolic Disorder Labs: No results found for: HGBA1C, MPG No results found for: PROLACTIN No results found for: CHOL, TRIG, HDL, CHOLHDL, VLDL, LDLCALC No results found for: TSH  Therapeutic Level Labs: No results found for: LITHIUM No results found for: VALPROATE No components found for:  CBMZ  Current Medications: Current Outpatient Medications  Medication Sig Dispense Refill  . clindamycin (CLEOCIN) 300 MG capsule Take 1 capsule (300 mg total) by mouth 3 (three) times daily. 21 capsule 0  . docusate sodium (COLACE) 100 MG capsule Take 1 capsule (100 mg total) by mouth 2 (two) times daily. 60 capsule 0  . FLUoxetine (PROZAC) 20 MG capsule Take 20 mg by mouth daily.  4  . ibuprofen (ADVIL,MOTRIN) 600 MG tablet Take 1 tablet (600  mg total) by mouth every 6 (six) hours as needed. 90 tablet 0  . lurasidone (LATUDA) 20 MG TABS tablet Take 1 tablet (20 mg total) by mouth daily. 30 tablet 0  . Prenatal Vit-Fe Fumarate-FA (PRENATAL MULTIVITAMIN) TABS tablet Take 1 tablet by mouth daily at 12 noon.    . valACYclovir (VALTREX) 500 MG tablet Take 500 mg by mouth 2 (two) times daily.  3   No current facility-administered medications for this visit.      Musculoskeletal: Strength & Muscle Tone: within normal limits Gait & Station: normal Patient leans: N/A  Psychiatric Specialty Exam: ROS  unknown if currently breastfeeding.There is no height or weight on file to calculate BMI.  General Appearance: Fairly Groomed  Eye Contact:  Good  Speech:  Clear and Coherent  Volume:  Normal  Mood:  {BHH MOOD:22306}  Affect:  {Affect (PAA):22687}  Thought Process:  Coherent  Orientation:   Full (Time, Place, and Person)  Thought Content: Logical   Suicidal Thoughts:  {ST/HT (PAA):22692}  Homicidal Thoughts:  {ST/HT (PAA):22692}  Memory:  Immediate;   Good  Judgement:  {Judgement (PAA):22694}  Insight:  {Insight (PAA):22695}  Psychomotor Activity:  Normal  Concentration:  Concentration: Good and Attention Span: Good  Recall:  Good  Fund of Knowledge: Good  Language: Good  Akathisia:  No  Handed:  Right  AIMS (if indicated): not done  Assets:  Communication Skills Desire for Improvement  ADL's:  Intact  Cognition: WNL  Sleep:  {BHH GOOD/FAIR/POOR:22877}   Screenings: GAD-7     Virtual BH Phone Follow Up from 06/08/2018 in [facility] Virtual [facility] Phone Follow Up from 06/05/2018 in [facility]  Total GAD-7 Score  15  16    PHQ2-9     Telephone from 06/23/2018 in [facility] Virtual [facility] Phone Follow Up from 06/08/2018 in [facility] Phone Follow Up from 06/05/2018 in [facility] Office Visit from 07/07/2017 in [facility] Office Visit from 11/30/2016 in [facility]  PHQ-2 Total Score  5  6  6   0  0  PHQ-9 Total Score  20  15  18   -  -       Assessment and Plan:  Angelica Bond is a 27 y.o. year old female with a history of mood disorder,  breast feeding, who presents for follow up appointment for No diagnosis found.  # Mood disorder, unspecified # r/o MDD # r/o bipolar II disorder Patient reports slightly worsening in neurovegetative symptoms over the past week, although she did respond well to fluoxetine. Psychosocial stressors including taking care of her baby, and her father completed suicide in 2004. Will continue fluoxetine at the current dose to target depression. Will start latuda for mood dysregulation; discussed with the patient that this medication has limited data in regards to potential risk to a  baby with breastfeeding. However, the benefit outweighs risk given her severity of symptoms. Discussed alternative option such as quetiapine, although it has more metabolic side effect. She is willing to try latuda. Although she will greatly benefit from CBT, she is not interested in this option due to her previous experience. Will continue to discuss as needed.   Plan 1. Continue fluoxetine 20 mg daily  2. Start latuda 20 mg daily  3. Return to clinic in one month for 30 mins 4. Emergency resources which includes 911, ED, suicide crisis line [phone]) are discussed.  I have reviewed suicide assessment in detail. No change in the following assessment.   The patient demonstrates the following risk factors for suicide: Chronic risk factors for suicide include:psychiatric disorder ofdepression, substance use disorder and previous suicide attemptsof overdosing, hanging herself. 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 Protective factorsfor this patient include: responsibility to others (children, family) and hope for the future. Considering these factors, the overall suicide risk at this point appears to below. Patientisappropriate for outpatient follow up. She denies gun access at home.  Neysa Hotter, MD 08/23/2018, 4:42 PM

## 2018-08-29 ENCOUNTER — Ambulatory Visit (HOSPITAL_COMMUNITY): Payer: Self-pay | Admitting: Psychiatry

## 2018-09-13 NOTE — Progress Notes (Deleted)
BH MD/PA/NP OP Progress Note  09/13/2018 4:38 PM Angelica Bond  MRN:  [id number]  Chief Complaint:  HPI: *** Visit Diagnosis: No diagnosis found.  Past Psychiatric History: Please see initial evaluation for full details. I have reviewed the history. No updates at this time.     Past Medical History:  Past Medical History:  Diagnosis Date  . Anxiety   . Depression   . Herpes   . Obesity     Past Surgical History:  Procedure Laterality Date  . CLOSED REDUCTION WRIST FRACTURE Right     Family Psychiatric History: Please see initial evaluation for full details. I have reviewed the history. No updates at this time.     Family History:  Family History  Problem Relation Age of Onset  . Thyroid disease Mother   . Hypertension Mother   . Arthritis Mother   . Anxiety disorder Mother   . Suicidality Father   . Bipolar disorder Father   . Drug abuse Father   . Healthy Brother   . Healthy Brother     Social History:  Social History   Socioeconomic History  . Marital status: Single    Spouse name: Not on file  . Number of children: Not on file  . Years of education: Not on file  . Highest education level: Not on file  Occupational History  . Not on file  Social Needs  . Financial resource strain: Not on file  . Food insecurity:    Worry: Not on file    Inability: Not on file  . Transportation needs:    Medical: Not on file    Non-medical: Not on file  Tobacco Use  . Smoking status: Never Smoker  . Smokeless tobacco: Never Used  Substance and Sexual Activity  . Alcohol use: Yes    Alcohol/week: 3.0 standard drinks    Types: 2 Cans of beer, 1 Shots of liquor per week  . Drug use: No  . Sexual activity: Not Currently  Lifestyle  . Physical activity:    Days per week: Not on file    Minutes per session: Not on file  . Stress: Not on file  Relationships  . Social connections:    Talks on phone: Not on file    Gets together: Not on file    Attends religious  service: Not on file    Active member of club or organization: Not on file    Attends meetings of clubs or organizations: Not on file    Relationship status: Not on file  Other Topics Concern  . Not on file  Social History Narrative  . Not on file    Allergies: No Known Allergies  Metabolic Disorder Labs: No results found for: HGBA1C, MPG No results found for: PROLACTIN No results found for: CHOL, TRIG, HDL, CHOLHDL, VLDL, LDLCALC No results found for: TSH  Therapeutic Level Labs: No results found for: LITHIUM No results found for: VALPROATE No components found for:  CBMZ  Current Medications: Current Outpatient Medications  Medication Sig Dispense Refill  . clindamycin (CLEOCIN) 300 MG capsule Take 1 capsule (300 mg total) by mouth 3 (three) times daily. 21 capsule 0  . docusate sodium (COLACE) 100 MG capsule Take 1 capsule (100 mg total) by mouth 2 (two) times daily. 60 capsule 0  . FLUoxetine (PROZAC) 20 MG capsule Take 20 mg by mouth daily.  4  . ibuprofen (ADVIL,MOTRIN) 600 MG tablet Take 1 tablet (600 mg total) by mouth every  6 (six) hours as needed. 90 tablet 0  . lurasidone (LATUDA) 20 MG TABS tablet Take 1 tablet (20 mg total) by mouth daily. 30 tablet 0  . Prenatal Vit-Fe Fumarate-FA (PRENATAL MULTIVITAMIN) TABS tablet Take 1 tablet by mouth daily at 12 noon.    . valACYclovir (VALTREX) 500 MG tablet Take 500 mg by mouth 2 (two) times daily.  3   No current facility-administered medications for this visit.      Musculoskeletal: Strength & Muscle Tone: within normal limits Gait & Station: normal Patient leans: N/A  Psychiatric Specialty Exam: ROS  unknown if currently breastfeeding.There is no height or weight on file to calculate BMI.  General Appearance: Fairly Groomed  Eye Contact:  Good  Speech:  Clear and Coherent  Volume:  Normal  Mood:  {BHH MOOD:22306}  Affect:  {Affect (PAA):22687}  Thought Process:  Coherent  Orientation:  Full (Time, Place, and  Person)  Thought Content: Logical   Suicidal Thoughts:  {ST/HT (PAA):22692}  Homicidal Thoughts:  {ST/HT (PAA):22692}  Memory:  Immediate;   Good  Judgement:  {Judgement (PAA):22694}  Insight:  {Insight (PAA):22695}  Psychomotor Activity:  Normal  Concentration:  Concentration: Good and Attention Span: Good  Recall:  Good  Fund of Knowledge: Good  Language: Good  Akathisia:  No  Handed:  Right  AIMS (if indicated): not done  Assets:  Communication Skills Desire for Improvement  ADL's:  Intact  Cognition: WNL  Sleep:  {BHH GOOD/FAIR/POOR:22877}   Screenings: GAD-7     [facility]  Total GAD-7 Score  15  16    PHQ2-9     [facility] 11/30/2016 [facility]  PHQ-2 Total Score  5  6  6   0  0  PHQ-9 Total Score  20  15  18   -  -       Assessment and Plan:  Angelica Bond is a 27 y.o. year old female with a history of mood disorder,  breast feeding, who presents for follow up appointment for No diagnosis found.  # Mood disorder, unspecified # r/o MDD # r/o bipolar II disorder Patient reports slightly worsening in neurovegetative symptoms over the past week, although she did respond well to fluoxetine. Psychosocial stressors including taking care of her baby, and her father completed suicide in 2004. Will continue fluoxetine at the current dose to target depression. Will start latuda for mood dysregulation; discussed with the patient that this medication has limited data in regards to potential risk to a baby with  breastfeeding. However, the benefit outweighs risk given her severity of symptoms. Discussed alternative option such as quetiapine, although it has more metabolic side effect. She is willing to try latuda. Although she will greatly benefit from CBT, she is not interested in this option due to her previous experience. Will continue to discuss as needed.   Plan 1. Continue fluoxetine 20 mg daily  2. Start latuda 20 mg daily  3. Return to clinic in one month for 30 mins 4. Emergency resources which includes 911, ED, suicide crisis line [phone]) are discussed.    The patient demonstrates  the following risk factors for suicide: Chronic risk factors for suicide include:psychiatric disorder ofdepression, substance use disorder and previous suicide attemptsof overdosing, hanging herself. 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 Protective factorsfor this patient include: responsibility to others (children, family) and hope for the future. Considering these factors, the overall suicide risk at this point appears to below. Patientisappropriate for outpatient follow up. She denies gun access at home.   Neysa Hotter, MD 09/13/2018, 4:38 PM

## 2018-09-19 ENCOUNTER — Ambulatory Visit (HOSPITAL_COMMUNITY): Payer: PRIVATE HEALTH INSURANCE | Admitting: Psychiatry

## 2021-04-01 ENCOUNTER — Ambulatory Visit: Payer: BC Managed Care – PPO | Admitting: Behavioral Health

## 2022-05-03 ENCOUNTER — Other Ambulatory Visit: Payer: Self-pay | Admitting: Internal Medicine

## 2022-05-04 LAB — COMPLETE METABOLIC PANEL WITH GFR
AG Ratio: 1.2 (calc) (ref 1.0–2.5)
ALT: 22 U/L (ref 6–29)
AST: 20 U/L (ref 10–30)
Albumin: 4.1 g/dL (ref 3.6–5.1)
Alkaline phosphatase (APISO): 59 U/L (ref 31–125)
BUN: 13 mg/dL (ref 7–25)
CO2: 23 mmol/L (ref 20–32)
Calcium: 9.3 mg/dL (ref 8.6–10.2)
Chloride: 106 mmol/L (ref 98–110)
Creat: 0.86 mg/dL (ref 0.50–0.97)
Globulin: 3.3 g/dL (calc) (ref 1.9–3.7)
Glucose, Bld: 64 mg/dL — ABNORMAL LOW (ref 65–99)
Potassium: 4.4 mmol/L (ref 3.5–5.3)
Sodium: 141 mmol/L (ref 135–146)
Total Bilirubin: 0.9 mg/dL (ref 0.2–1.2)
Total Protein: 7.4 g/dL (ref 6.1–8.1)
eGFR: 93 mL/min/{1.73_m2} (ref >=60)

## 2022-05-04 LAB — CBC
HCT: 42.1 % (ref 35.0–45.0)
Hemoglobin: 13.6 g/dL (ref 11.7–15.5)
MCH: 28.2 pg (ref 27.0–33.0)
MCHC: 32.3 g/dL (ref 32.0–36.0)
MCV: 87.3 fL (ref 80.0–100.0)
MPV: 11.3 fL (ref 7.5–12.5)
Platelets: 316 10*3/uL (ref 140–400)
RBC: 4.82 10*6/uL (ref 3.80–5.10)
RDW: 12.9 % (ref 11.0–15.0)
WBC: 10.6 10*3/uL (ref 3.8–10.8)

## 2022-05-04 LAB — TSH: TSH: 1.08 mIU/L

## 2022-05-04 LAB — LIPID PANEL
Cholesterol: 183 mg/dL (ref <200)
HDL: 48 mg/dL — ABNORMAL LOW (ref >=50)
LDL Cholesterol (Calc): 120 mg/dL (calc) — ABNORMAL HIGH
Non-HDL Cholesterol (Calc): 135 mg/dL (calc) — ABNORMAL HIGH (ref <130)
Total CHOL/HDL Ratio: 3.8 (calc) (ref <5.0)
Triglycerides: 55 mg/dL (ref <150)

## 2022-05-04 LAB — VITAMIN D 25 HYDROXY (VIT D DEFICIENCY, FRACTURES): Vit D, 25-Hydroxy: 28 ng/mL — ABNORMAL LOW (ref 30–100)

## 2024-09-07 ENCOUNTER — Encounter (HOSPITAL_BASED_OUTPATIENT_CLINIC_OR_DEPARTMENT_OTHER): Payer: Self-pay | Admitting: Orthopedic Surgery

## 2024-09-07 ENCOUNTER — Other Ambulatory Visit: Payer: Self-pay

## 2024-09-13 ENCOUNTER — Encounter (HOSPITAL_BASED_OUTPATIENT_CLINIC_OR_DEPARTMENT_OTHER): Payer: Self-pay | Admitting: Orthopedic Surgery

## 2024-09-13 NOTE — Anesthesia Preprocedure Evaluation (Signed)
 Anesthesia Evaluation  Patient identified by MRN, date of birth, ID band Patient awake    Reviewed: Allergy & Precautions, NPO status , Patient's Chart, lab work & pertinent test results  Airway Mallampati: II  TM Distance: >3 FB Neck ROM: Full    Dental no notable dental hx. (+) Teeth Intact, Dental Advisory Given   Pulmonary neg pulmonary ROS   Pulmonary exam normal breath sounds clear to auscultation       Cardiovascular negative cardio ROS Normal cardiovascular exam Rhythm:Regular Rate:Normal     Neuro/Psych  PSYCHIATRIC DISORDERS Anxiety Depression    negative neurological ROS     GI/Hepatic negative GI ROS, Neg liver ROS,,,  Endo/Other    Class 3 obesityHLD  Renal/GU negative Renal ROS  negative genitourinary   Musculoskeletal ACL tear right knee Torn medial meniscus right knee   Abdominal  (+) + obese  Peds  Hematology negative hematology ROS (+)   Anesthesia Other Findings   Reproductive/Obstetrics HSV                              Anesthesia Physical Anesthesia Plan  ASA: 2  Anesthesia Plan: General   Post-op Pain Management: Dilaudid  IV, Minimal or no pain anticipated, Precedex, Ofirmev  IV (intra-op)* and Ketamine  IV*   Induction: Intravenous  PONV Risk Score and Plan: 4 or greater and Treatment may vary due to age or medical condition, Midazolam , Ondansetron  and Dexamethasone   Airway Management Planned: LMA  Additional Equipment: None  Intra-op Plan:   Post-operative Plan: Extubation in OR  Informed Consent: I have reviewed the patients History and Physical, chart, labs and discussed the procedure including the risks, benefits and alternatives for the proposed anesthesia with the patient or authorized representative who has indicated his/her understanding and acceptance.     Dental advisory given  Plan Discussed with: CRNA and Anesthesiologist  Anesthesia  Plan Comments:          Anesthesia Quick Evaluation

## 2024-09-14 ENCOUNTER — Ambulatory Visit (HOSPITAL_BASED_OUTPATIENT_CLINIC_OR_DEPARTMENT_OTHER)
Admission: RE | Admit: 2024-09-14 | Discharge: 2024-09-14 | Disposition: A | Discharge status: Home / Self Care | Payer: Self-pay | Attending: Orthopedic Surgery | Admitting: Orthopedic Surgery

## 2024-09-14 ENCOUNTER — Encounter (HOSPITAL_BASED_OUTPATIENT_CLINIC_OR_DEPARTMENT_OTHER)
Admission: RE | Disposition: A | Discharge status: Home / Self Care | Payer: Self-pay | Source: Home / Self Care | Attending: Orthopedic Surgery

## 2024-09-14 ENCOUNTER — Encounter (HOSPITAL_BASED_OUTPATIENT_CLINIC_OR_DEPARTMENT_OTHER): Payer: Self-pay | Admitting: Orthopedic Surgery

## 2024-09-14 ENCOUNTER — Encounter (HOSPITAL_BASED_OUTPATIENT_CLINIC_OR_DEPARTMENT_OTHER): Payer: Self-pay | Admitting: Anesthesiology

## 2024-09-14 ENCOUNTER — Ambulatory Visit (HOSPITAL_BASED_OUTPATIENT_CLINIC_OR_DEPARTMENT_OTHER): Payer: Self-pay | Admitting: Anesthesiology

## 2024-09-14 ENCOUNTER — Other Ambulatory Visit: Payer: Self-pay

## 2024-09-14 DIAGNOSIS — S83241A Other tear of medial meniscus, current injury, right knee, initial encounter: Secondary | ICD-10-CM

## 2024-09-14 DIAGNOSIS — Z01818 Encounter for other preprocedural examination: Secondary | ICD-10-CM

## 2024-09-14 DIAGNOSIS — S83511A Sprain of anterior cruciate ligament of right knee, initial encounter: Secondary | ICD-10-CM | POA: Insufficient documentation

## 2024-09-14 DIAGNOSIS — Z6841 Body Mass Index (BMI) 40.0 and over, adult: Secondary | ICD-10-CM | POA: Diagnosis not present

## 2024-09-14 DIAGNOSIS — E66813 Obesity, class 3: Secondary | ICD-10-CM | POA: Insufficient documentation

## 2024-09-14 DIAGNOSIS — E785 Hyperlipidemia, unspecified: Secondary | ICD-10-CM | POA: Diagnosis not present

## 2024-09-14 DIAGNOSIS — X58XXXA Exposure to other specified factors, initial encounter: Secondary | ICD-10-CM | POA: Diagnosis not present

## 2024-09-14 HISTORY — PX: KNEE ARTHROSCOPY WITH MENISCAL REPAIR: SHX5653

## 2024-09-14 LAB — NO BLOOD PRODUCTS

## 2024-09-14 LAB — POCT PREGNANCY, URINE: Preg Test, Ur: NEGATIVE

## 2024-09-14 SURGERY — KNEE ARTHROSCOPY WITH ANTERIOR CRUCIATE LIGAMENT (ACL) RECONSTRUCTION WITH HAMSTRING GRAFT
Anesthesia: General | Site: Knee | Laterality: Right

## 2024-09-14 MED ORDER — BUPIVACAINE HCL (PF) 0.25 % IJ SOLN
INTRAMUSCULAR | Status: AC
  Filled 2024-09-14: qty 30

## 2024-09-14 MED ORDER — SODIUM CHLORIDE 0.9 % IR SOLN
Status: DC | PRN
  Administered 2024-09-14: 4000 mL

## 2024-09-14 MED ORDER — PROPOFOL 10 MG/ML IV BOLUS
INTRAVENOUS | Status: DC | PRN
  Administered 2024-09-14: 200 mg via INTRAVENOUS
  Administered 2024-09-14: 100 mg via INTRAVENOUS

## 2024-09-14 MED ORDER — KETOROLAC TROMETHAMINE 30 MG/ML IJ SOLN
INTRAMUSCULAR | Status: AC
  Filled 2024-09-14: qty 1

## 2024-09-14 MED ORDER — SUGAMMADEX SODIUM 200 MG/2ML IV SOLN
INTRAVENOUS | Status: DC | PRN
  Administered 2024-09-14: 200 mg via INTRAVENOUS

## 2024-09-14 MED ORDER — KETAMINE HCL 50 MG/5ML IJ SOSY
PREFILLED_SYRINGE | INTRAMUSCULAR | Status: DC | PRN
  Administered 2024-09-14: 20 mg via INTRAVENOUS
  Administered 2024-09-14: 30 mg via INTRAVENOUS

## 2024-09-14 MED ORDER — ALBUTEROL SULFATE HFA 108 (90 BASE) MCG/ACT IN AERS
INHALATION_SPRAY | RESPIRATORY_TRACT | Status: AC
  Filled 2024-09-14: qty 6.7

## 2024-09-14 MED ORDER — ALBUTEROL SULFATE HFA 108 (90 BASE) MCG/ACT IN AERS
INHALATION_SPRAY | RESPIRATORY_TRACT | Status: DC | PRN
  Administered 2024-09-14: 2 via RESPIRATORY_TRACT

## 2024-09-14 MED ORDER — BUPIVACAINE-EPINEPHRINE (PF) 0.25% -1:200000 IJ SOLN
INTRAMUSCULAR | Status: AC
Start: 2024-09-14 — End: 2024-09-14
  Filled 2024-09-14: qty 30

## 2024-09-14 MED ORDER — KETOROLAC TROMETHAMINE 30 MG/ML IJ SOLN
INTRAMUSCULAR | Status: DC | PRN
  Administered 2024-09-14: 30 mg via INTRAVENOUS

## 2024-09-14 MED ORDER — DEXAMETHASONE SODIUM PHOSPHATE 10 MG/ML IJ SOLN
INTRAMUSCULAR | Status: DC | PRN
  Administered 2024-09-14: 4 mg via PERINEURAL

## 2024-09-14 MED ORDER — HYDROMORPHONE HCL 1 MG/ML IJ SOLN
0.2500 mg | INTRAMUSCULAR | Status: DC | PRN
  Administered 2024-09-14 (×4): 0.5 mg via INTRAVENOUS

## 2024-09-14 MED ORDER — LIDOCAINE 2% (20 MG/ML) 5 ML SYRINGE
INTRAMUSCULAR | Status: AC
  Filled 2024-09-14: qty 5

## 2024-09-14 MED ORDER — ACETAMINOPHEN 10 MG/ML IV SOLN
INTRAVENOUS | Status: DC | PRN
  Administered 2024-09-14: 1000 mg via INTRAVENOUS

## 2024-09-14 MED ORDER — FENTANYL CITRATE (PF) 100 MCG/2ML IJ SOLN
INTRAMUSCULAR | Status: DC | PRN
  Administered 2024-09-14 (×4): 50 ug via INTRAVENOUS

## 2024-09-14 MED ORDER — CEFAZOLIN SODIUM-DEXTROSE 2-4 GM/100ML-% IV SOLN
INTRAVENOUS | Status: AC
Start: 2024-09-14 — End: 2024-09-14
  Filled 2024-09-14: qty 100

## 2024-09-14 MED ORDER — ONDANSETRON HCL 4 MG/2ML IJ SOLN
INTRAMUSCULAR | Status: AC
  Filled 2024-09-14: qty 2

## 2024-09-14 MED ORDER — HYDROMORPHONE HCL 1 MG/ML IJ SOLN
INTRAMUSCULAR | Status: AC
  Filled 2024-09-14: qty 0.5

## 2024-09-14 MED ORDER — ONDANSETRON HCL 4 MG/2ML IJ SOLN: 4.0000 mg | Freq: Once | INTRAMUSCULAR | Status: DC | PRN

## 2024-09-14 MED ORDER — EPINEPHRINE PF 1 MG/ML IJ SOLN
INTRAMUSCULAR | Status: AC
  Filled 2024-09-14: qty 1

## 2024-09-14 MED ORDER — FENTANYL CITRATE (PF) 100 MCG/2ML IJ SOLN
100.0000 ug | Freq: Once | INTRAMUSCULAR | Status: AC
  Administered 2024-09-14: 50 ug via INTRAVENOUS

## 2024-09-14 MED ORDER — DROPERIDOL 2.5 MG/ML IJ SOLN: 0.6250 mg | Freq: Once | INTRAMUSCULAR | Status: DC | PRN

## 2024-09-14 MED ORDER — ROCURONIUM BROMIDE 10 MG/ML (PF) SYRINGE
PREFILLED_SYRINGE | INTRAVENOUS | Status: DC | PRN
  Administered 2024-09-14: 70 mg via INTRAVENOUS

## 2024-09-14 MED ORDER — CEFAZOLIN SODIUM-DEXTROSE 2-4 GM/100ML-% IV SOLN
2.0000 g | INTRAVENOUS | Status: AC
  Administered 2024-09-14: 2 g via INTRAVENOUS

## 2024-09-14 MED ORDER — OXYCODONE HCL 5 MG PO TABS: 5.0000 mg | ORAL_TABLET | ORAL | 0 refills | Status: AC | PRN

## 2024-09-14 MED ORDER — PROPOFOL 500 MG/50ML IV EMUL
INTRAVENOUS | Status: AC
  Filled 2024-09-14: qty 50

## 2024-09-14 MED ORDER — LACTATED RINGERS IV SOLN: INTRAVENOUS | Status: DC

## 2024-09-14 MED ORDER — ACETAMINOPHEN 10 MG/ML IV SOLN
INTRAVENOUS | Status: AC
Start: 2024-09-14 — End: 2024-09-14
  Filled 2024-09-14: qty 100

## 2024-09-14 MED ORDER — ROPIVACAINE HCL 5 MG/ML IJ SOLN
INTRAMUSCULAR | Status: DC | PRN
  Administered 2024-09-14: 30 mL via PERINEURAL

## 2024-09-14 MED ORDER — LIDOCAINE 2% (20 MG/ML) 5 ML SYRINGE
INTRAMUSCULAR | Status: DC | PRN
  Administered 2024-09-14: 100 mg via INTRAVENOUS

## 2024-09-14 MED ORDER — OXYCODONE HCL 5 MG/5ML PO SOLN: 5.0000 mg | Freq: Once | ORAL | Status: AC | PRN

## 2024-09-14 MED ORDER — METHOCARBAMOL 1000 MG/10ML IJ SOLN
500.0000 mg | Freq: Once | INTRAMUSCULAR | Status: AC
  Administered 2024-09-14: 500 mg via INTRAVENOUS

## 2024-09-14 MED ORDER — ROCURONIUM BROMIDE 10 MG/ML (PF) SYRINGE
PREFILLED_SYRINGE | INTRAVENOUS | Status: AC
Start: 2024-09-14 — End: 2024-09-14
  Filled 2024-09-14: qty 10

## 2024-09-14 MED ORDER — METHOCARBAMOL 1000 MG/10ML IJ SOLN
INTRAMUSCULAR | Status: AC
  Filled 2024-09-14: qty 10

## 2024-09-14 MED ORDER — MIDAZOLAM HCL 2 MG/2ML IJ SOLN
INTRAMUSCULAR | Status: AC
  Filled 2024-09-14: qty 2

## 2024-09-14 MED ORDER — FENTANYL CITRATE (PF) 100 MCG/2ML IJ SOLN
INTRAMUSCULAR | Status: AC
  Filled 2024-09-14: qty 2

## 2024-09-14 MED ORDER — DEXAMETHASONE SODIUM PHOSPHATE 10 MG/ML IJ SOLN
INTRAMUSCULAR | Status: AC
Start: 2024-09-14 — End: 2024-09-14
  Filled 2024-09-14: qty 1

## 2024-09-14 MED ORDER — OXYCODONE HCL 5 MG PO TABS
5.0000 mg | ORAL_TABLET | Freq: Once | ORAL | Status: AC | PRN
  Administered 2024-09-14: 5 mg via ORAL

## 2024-09-14 MED ORDER — KETAMINE HCL 50 MG/5ML IJ SOSY
PREFILLED_SYRINGE | INTRAMUSCULAR | Status: AC
  Filled 2024-09-14: qty 5

## 2024-09-14 MED ORDER — SODIUM CHLORIDE 0.9 % IR SOLN
Status: DC | PRN
  Administered 2024-09-14: 3000 mL

## 2024-09-14 MED ORDER — ONDANSETRON 4 MG PO TBDP: 4.0000 mg | ORAL_TABLET | Freq: Three times a day (TID) | ORAL | 0 refills | Status: AC | PRN

## 2024-09-14 MED ORDER — OXYCODONE HCL 5 MG PO TABS
ORAL_TABLET | ORAL | Status: AC
  Filled 2024-09-14: qty 1

## 2024-09-14 MED ORDER — MIDAZOLAM HCL 2 MG/2ML IJ SOLN
2.0000 mg | Freq: Once | INTRAMUSCULAR | Status: AC
  Administered 2024-09-14: 2 mg via INTRAVENOUS

## 2024-09-14 MED ORDER — PROPOFOL 500 MG/50ML IV EMUL
INTRAVENOUS | Status: AC
Start: 2024-09-14 — End: 2024-09-14
  Filled 2024-09-14: qty 50

## 2024-09-14 MED ORDER — DEXAMETHASONE SODIUM PHOSPHATE 10 MG/ML IJ SOLN
INTRAMUSCULAR | Status: DC | PRN
  Administered 2024-09-14: 10 mg via INTRAVENOUS

## 2024-09-14 SURGICAL SUPPLY — 70 items
ANCHOR BUTTON TIGHTROPE 14 (Anchor) IMPLANT
ANCHOR TIGHTROPE II 20 W/IB (Anchor) IMPLANT
BLADE SHAVER TORPEDO 4X13 (MISCELLANEOUS) ×1 IMPLANT
BLADE SURG 15 STRL LF DISP TIS (BLADE) ×2 IMPLANT
BNDG COMPR ESMARK 6X3 LF (GAUZE/BANDAGES/DRESSINGS) IMPLANT
BNDG ELASTIC 6INX 5YD STR LF (GAUZE/BANDAGES/DRESSINGS) ×1 IMPLANT
BURR OVAL 8 FLU 4.0X13 (MISCELLANEOUS) ×1 IMPLANT
BURR OVAL 8 FLU 5.0X13 (MISCELLANEOUS) IMPLANT
CANNULA 5.75X71 LONG (CANNULA) IMPLANT
CANNULA TWIST IN 8.25X7CM (CANNULA) IMPLANT
CLSR STERI-STRIP ANTIMIC 1/2X4 (GAUZE/BANDAGES/DRESSINGS) ×1 IMPLANT
COOLER ICEMAN CLASSIC (MISCELLANEOUS) ×1 IMPLANT
COVER BACK TABLE 60X90IN (DRAPES) ×1 IMPLANT
CUFF TRNQT CYL 34X4.125X (TOURNIQUET CUFF) ×1 IMPLANT
CUTTER BONE 4.0MM X 13CM (MISCELLANEOUS) ×1 IMPLANT
DRAPE INCISE IOBAN 66X45 STRL (DRAPES) IMPLANT
DRAPE U-SHAPE 47X51 STRL (DRAPES) ×1 IMPLANT
DRAPE-T ARTHROSCOPY W/POUCH (DRAPES) ×1 IMPLANT
DRILL FLIPCUTTER III 6-12 (ORTHOPEDIC DISPOSABLE SUPPLIES) IMPLANT
DURAPREP 26ML APPLICATOR (WOUND CARE) ×1 IMPLANT
ELECTRODE REM PT RTRN 9FT ADLT (ELECTROSURGICAL) ×1 IMPLANT
FIBERSTICK 2 (SUTURE) IMPLANT
GAUZE PAD ABD 8X10 STRL (GAUZE/BANDAGES/DRESSINGS) ×1 IMPLANT
GAUZE SPONGE 4X4 12PLY STRL (GAUZE/BANDAGES/DRESSINGS) ×1 IMPLANT
GAUZE XEROFORM 1X8 LF (GAUZE/BANDAGES/DRESSINGS) ×1 IMPLANT
GEL BONE GRAFT DBM ALLOSYNC 5 (Bone Implant) IMPLANT
GLOVE BIO SURGEON STRL SZ7.5 (GLOVE) ×2 IMPLANT
GLOVE BIOGEL PI IND STRL 8 (GLOVE) ×2 IMPLANT
GOWN STRL REUS W/ TWL LRG LVL3 (GOWN DISPOSABLE) ×1 IMPLANT
GOWN STRL REUS W/ TWL XL LVL3 (GOWN DISPOSABLE) ×1 IMPLANT
GOWN STRL REUS W/TWL XL LVL3 (GOWN DISPOSABLE) ×2 IMPLANT
GRAFT TISS 60-80 FRZN TENDON (Tissue) IMPLANT
IMPL FIBERSTITCH 1.5X24 CVD (Anchor) IMPLANT
IMPL QUADLINK SYSTEM 10 (Orthopedic Implant) IMPLANT
IMPL SYS 2ND FX PEEK 4.75X19.1 (Miscellaneous) IMPLANT
KIT BIOCARTILAGE LG JOINT MIX (KITS) IMPLANT
KIT SUTLOC MENISCAL ROOT REP (Anchor) IMPLANT
KNIFE GRAFT ACL 10MM 5952 (MISCELLANEOUS) IMPLANT
KNIFE GRAFT ACL 9MM (MISCELLANEOUS) IMPLANT
MANIFOLD NEPTUNE II (INSTRUMENTS) ×1 IMPLANT
NDL SUT 2-0 SCORPION KNEE (NEEDLE) IMPLANT
NEEDLE SUT 2-0 SCORPION KNEE (NEEDLE) IMPLANT
PACK ARTHROSCOPY DSU (CUSTOM PROCEDURE TRAY) ×1 IMPLANT
PACK BASIN DAY SURGERY FS (CUSTOM PROCEDURE TRAY) ×1 IMPLANT
PAD COLD SHLDR WRAP-ON (PAD) ×1 IMPLANT
PENCIL SMOKE EVACUATOR (MISCELLANEOUS) IMPLANT
PORTAL SKID DEVICE (INSTRUMENTS) IMPLANT
SHEET MEDIUM DRAPE 40X70 STRL (DRAPES) ×1 IMPLANT
SLEEVE SCD COMPRESS KNEE MED (STOCKING) ×1 IMPLANT
SPONGE T-LAP 4X18 ~~LOC~~+RFID (SPONGE) ×1 IMPLANT
SUCTION TUBE FRAZIER 10FR DISP (SUCTIONS) ×1 IMPLANT
SUT ETHILON 4 0 PS 2 18 (SUTURE) IMPLANT
SUT MNCRL AB 3-0 PS2 18 (SUTURE) ×1 IMPLANT
SUT MON AB 2-0 CT1 36 (SUTURE) ×1 IMPLANT
SUT PDS AB 0 CT 36 (SUTURE) IMPLANT
SUT VIC AB 0 CT1 27XBRD ANBCTR (SUTURE) IMPLANT
SUT VIC AB 1 CT1 27XBRD ANBCTR (SUTURE) IMPLANT
SUT VIC AB 2-0 CT1 TAPERPNT 27 (SUTURE) ×1 IMPLANT
SUT VICRYL 0 UR6 27IN ABS (SUTURE) IMPLANT
SUTURE FIBERWR #2 38 T-5 BLUE (SUTURE) IMPLANT
SUTURE FIBERWR#2 38 REV NDL BL (SUTURE) IMPLANT
SUTURE TAPE 1.3 FIBERLOP 20 ST (SUTURE) IMPLANT
SUTURE TAPE TIGERLINK 1.3MM BL (SUTURE) IMPLANT
SYSTEM ALLOGRAFT GRAFTLINK CP2 (Anchor) IMPLANT
TAPE LABRALWHITE 1.5X36 (TAPE) IMPLANT
TOWEL GREEN STERILE FF (TOWEL DISPOSABLE) ×2 IMPLANT
TUBE CONNECTING 20X1/4 (TUBING) IMPLANT
TUBE SUCTION HIGH CAP CLEAR NV (SUCTIONS) ×1 IMPLANT
TUBING ARTHROSCOPY IRRIG 16FT (MISCELLANEOUS) ×1 IMPLANT
WAND ABLATOR APOLLO I90 (BUR) IMPLANT

## 2024-09-14 NOTE — Brief Op Note (Signed)
 09/14/2024  10:16 AM  PATIENT:  Angelica Bond  33 y.o. female  PRE-OPERATIVE DIAGNOSIS:  Right knee anterior cruciate ligament tear and medial meniscus tear  POST-OPERATIVE DIAGNOSIS:  Right knee anterior cruciate ligament tear and medial meniscus tear  PROCEDURE:  Procedure(s) with comments: KNEE ARTHROSCOPY WITH ANTERIOR CRUCIATE LIGAMENT (ACL) RECONSTRUCTION WITH HAMSTRING GRAFT (Right) - Right knee arthroscopy with anterior cruciate ligament reconstruction with allograft and possible medial meniscus repair ARTHROSCOPY, KNEE, WITH MENISCUS REPAIR (Right)  SURGEON:  Surgeons and Role:    * Sharl Selinda Dover, MD - Primary  ASSISTANTS: Dayle Moores, PA-C   ANESTHESIA:   regional and general  EBL:  20 mL   BLOOD ADMINISTERED:none  DRAINS: none   LOCAL MEDICATIONS USED:  NONE  SPECIMEN:  No Specimen  DISPOSITION OF SPECIMEN:  N/A  COUNTS:  YES  TOURNIQUET:  * No tourniquets in log *  DICTATION: .Note written in EPIC  PLAN OF CARE: Discharge to home after PACU  PATIENT DISPOSITION:  PACU - hemodynamically stable.   Delay start of Pharmacological VTE agent (>24hrs) due to surgical blood loss or risk of bleeding: not applicable

## 2024-09-14 NOTE — H&P (Signed)
   ORTHOPAEDIC H and P  REQUESTING PHYSICIAN: Sharl Selinda Dover, MD  PCP:  Pcp, No  Chief Complaint: Right knee pain  HPI: Angelica Bond is a 33 y.o. female who complains of right knee pain and instability.  Here today for ACL reconstruction and possible medial meniscus repair.  Past Medical History:  Diagnosis Date   Anxiety    Depression    Herpes    Obesity    Past Surgical History:  Procedure Laterality Date   CLOSED REDUCTION WRIST FRACTURE Right    Social History   Socioeconomic History   Marital status: Single    Spouse name: Not on file   Number of children: Not on file   Years of education: Not on file   Highest education level: Not on file  Occupational History   Not on file  Tobacco Use   Smoking status: Never   Smokeless tobacco: Never  Vaping Use   Vaping status: Never Used  Substance and Sexual Activity   Alcohol use: Yes    Alcohol/week: 3.0 standard drinks of alcohol    Types: 2 Cans of beer, 1 Shots of liquor per week   Drug use: No   Sexual activity: Not Currently  Other Topics Concern   Not on file  Social History Narrative   Not on file   Social Drivers of Health   Financial Resource Strain: Not on file  Food Insecurity: Not on file  Transportation Needs: Not on file  Physical Activity: Not on file  Stress: Not on file  Social Connections: Not on file   Family History  Problem Relation Age of Onset   Thyroid disease Mother    Hypertension Mother    Arthritis Mother    Anxiety disorder Mother    Suicidality Father    Bipolar disorder Father    Drug abuse Father    Healthy Brother    Healthy Brother    No Known Allergies Prior to Admission medications   Medication Sig Start Date End Date Taking? Authorizing Provider  ALPRAZolam (XANAX) 1 MG tablet Take 1 mg by mouth at bedtime as needed for anxiety.   Yes [provider]  FLUoxetine (PROZAC) 20 MG capsule Take 20 mg by mouth daily. 06/12/18  Yes [provider]  QUEtiapine (SEROQUEL) 50 MG tablet Take 50 mg by mouth at bedtime.   Yes [provider]  valACYclovir (VALTREX) 500 MG tablet Take 500 mg by mouth 2 (two) times daily. 06/09/18  Yes [provider]   No results found.  Positive ROS: All other systems have been reviewed and were otherwise negative with the exception of those mentioned in the HPI and as above.  Physical Exam: General: Alert, no acute distress Cardiovascular: No pedal edema Respiratory: No cyanosis, no use of accessory musculature GI: No organomegaly, abdomen is soft and non-tender Skin: No lesions in the area of chief complaint Neurologic: Sensation intact distally Psychiatric: Patient is competent for consent with normal mood and affect Lymphatic: No axillary or cervical lymphadenopathy  MUSCULOSKELETAL: RLE-  wwp, NVI  Assessment:  Right knee ACL tear Right knee medial meniscus tear  Plan: - OR today for arthroscopic acl reconstuction and medial meniscus work as necessary.  - dc home today post op      Selinda Dover Sharl, MD Cell [phone]    09/14/2024 7:38 AM-r

## 2024-09-14 NOTE — Progress Notes (Signed)
Assisted Dr. Foster with right, adductor canal, ultrasound guided block. Side rails up, monitors on throughout procedure. See vital signs in flow sheet. Tolerated Procedure well.  

## 2024-09-14 NOTE — Anesthesia Procedure Notes (Signed)
 Anesthesia Regional Block: Adductor canal block   Pre-Anesthetic Checklist: , timeout performed,  Correct Patient, Correct Site, Correct Laterality,  Correct Procedure, Correct Position, site marked,  Risks and benefits discussed,  Surgical consent,  Pre-op evaluation,  At surgeon's request and post-op pain management  Laterality: Right  Prep: chloraprep       Needles:  Injection technique: Single-shot  Needle Type: Echogenic Stimulator Needle     Needle Length: 10cm  Needle Gauge: 21   Needle insertion depth: 8 cm   Additional Needles:   Procedures:,,,, ultrasound used (permanent image in chart),,    Narrative:  Start time: 09/14/2024 8:29 AM End time: 09/14/2024 8:34 AM Injection made incrementally with aspirations every 5 mL.  Performed by: Personally  Anesthesiologist: Jerrye Sharper, MD  Additional Notes: Timeout performed. Patient sedated. Relevant anatomy ID'd using US . Incremental 2-5ml injection of LA with frequent aspiration. Patient tolerated procedure well.

## 2024-09-14 NOTE — Transfer of Care (Signed)
 Immediate Anesthesia Transfer of Care Note  Patient: Angelica Bond  Procedure(s) Performed: KNEE ARTHROSCOPY WITH ANTERIOR CRUCIATE LIGAMENT (ACL) RECONSTRUCTION WITH HAMSTRING GRAFT (Right: Knee) ARTHROSCOPY, KNEE, WITH MENISCUS REPAIR (Right: Knee)  Patient Location: PACU  Anesthesia Type:General  Level of Consciousness: awake and patient cooperative  Airway & Oxygen Therapy: Patient Spontanous Breathing and Patient connected to face mask oxygen  Post-op Assessment: Report given to RN and Post -op Vital signs reviewed and stable  Post vital signs: Reviewed and stable  Last Vitals:  Vitals Value Taken Time  BP 157/112 09/14/24 10:20  Temp 36.2 C 09/14/24 10:19  Pulse 98 09/14/24 10:25  Resp 18 09/14/24 10:25  SpO2 100 % 09/14/24 10:25  Vitals shown include unfiled device data.  Last Pain:  Vitals:   09/14/24 0759  TempSrc: Temporal  PainSc: 0-No pain         Complications: No notable events documented.

## 2024-09-14 NOTE — Op Note (Signed)
 Surgery Date: 09/14/2024    Surgeon(s): Sharl Selinda Dover, MD   ASSIST: Dayle Moores, PA-C  Assistant attestation:  PA McClung scrubbed and present for the entire procedure.   Implants:  Arthrex all inside cortical buttons on femur and tibia 4.75 PEEK swivel lock x 1.  FGL hamstring allograft  Arthrex Allosync demineralized bone matrix for tunnel bone grafting  Arthrex fiber stitch x 1 for medial meniscus repair   ANESTHESIA:  general, and adductor block   IV FLUIDS AND URINE: See anesthesia.   TOURNIQUET: N/A   DRAINS: none   COMPLICATIONS: None.     ESTIMATED BLOOD LOSS: minimal   PREOPERATIVE DIAGNOSES:  1. Right knee medial meniscus tear 2. Right knee complete ACL rupture   POSTOPERATIVE DIAGNOSES:  same   PROCEDURES PERFORMED:  1. Right knee arthroscopy with ACL reconstruction with hamstring allograft 2. Right Knee arthroscopy with medial meniscus repair    DESCRIPTION OF PROCEDURE:  Angelica Bond is a 33 yo female with Right knee Complete ACL rupture, partial MCL tear, medial meniscus tear.  They sustained these injuries about 2 months prior to coming to the operating room today.  After a short period of prehabilitation to allow for return of ROM and quadriceps strenght, we discussed proceeding with arthroscopically assisted hamstring autograft ACL reconstruction and medial meniscectomy versus repair.  We reviewed the risks benefits and indications of this procedure including but not limited to bleeding, infection, damage to neurovascular structures, need for future surgery, developed an of arthrosis, rupture of graft, continued instability of the knee, and developement of blood clots and risk of anesthesia.  All questions answered.   The patient was identified in the preoperative holding area and the operative extremity was marked. The patient was brought to the operating room and transferred to operating table in a supine position. Satisfactory general  anesthesia was induced by anesthesiology.     Examination under anesthesia revealed a grade 2B Lachman, grade 1 pivot shift, and stable to varus and valgus stress.       At the back table, we next prepared the allograft by the manufacturer's guidelines for the all inside technique utilizing the graft was pretensioned on the back table and wrapped in a saline-soaked gauze.  The graft measured 66 mm in total length, 8.5 mm on femoral tunnel diameter, and 8.5 mm diameter on the tibial tunnel.   Standard anterolateral, anteromedial arthroscopy portals were obtained. The anteromedial p Arthrex Endobutton close adjustable loop on the femur and tibia.  Ortal was obtained with a spinal needle for localization under direct visualization with subsequent diagnostic findings.    Anteromedial and anterolateral chambers: mild synovitis. The synovitis was debrided with a 4.5 mm full radius shaver through both the anteromedial and lateral portals.    Suprapatellar pouch and gutters: no synovitis or debris. Patella chondral surface: Grade 0 Trochlear chondral surface: Grade 0 Patellofemoral tracking: Midline, no tilt Medial meniscus: Oblique/small vertical tear of the red zone of the posterior horn medial meniscus at the capsule. Medial femoral condyle flexion bearing surface: Grade 0 Medial femoral condyle extension bearing surface: Grade 0 Medial tibial plateau: Grade 0 Anterior cruciate ligament:Complete mid substance tear Posterior cruciate ligament:stable Lateral meniscus: no tear  Lateral femoral condyle flexion bearing surface: Grade 0 Lateral femoral condyle extension bearing surface: Grade 0 Lateral tibial plateau: Grade     After debriding the interval as well as off his fat pad for visualization we then removed the ACL stump to perform a small notchplasty.  Next we turned our attention to the medial meniscus repair.  We first biologically prepared the interface of the tear of the posterior horn  medial meniscus with spinal needle trephination.  Next we brought in the Arthrex 24 degree fiber stitch all inside repair device.  A horizontal mattress was placed across the tear with excellent compression.   Next, the ACL reconstruction was undertaken. The ACL stump was removed with thermal ablation and shaver and anatomic bony landmarks were marked for the placement of the femoral and tibial sockets.  Arthrex retroguides and Flipcutters were used to create the sockets and perform the procedure by an all-inside GraftLink technique.  The femoral socket was created at the inferior portion of the bifurcate ridge of the lateral femoral wall with a size 8.5 mm FlipCutter to a depth of  15 mm while the tibial socket was created at the center of the ACL footprint from front to back and toward the base of the medial tibial eminence from medial to lateral, to a depth of 23-25 mm with a 8.5 mm FlipCutter. Bony debris was removed and the edges of socket apertures were smoothed. Suture shuttles were used to deliver the graft into the femoral socket first and the tibial socket second.  Prior to delivering the graft into the aperture of the femoral socket we placed a cc of Arthrex flowable bone graft into the aperture, the same was done on the tibial side.  The graft was then secured within the sockets, cinching the self-locking sutures overtop of the proximal and distal cortical buttons with the knee in a reduced position maintained at 20 degrees flexion while a moderate force posterior drawer was applied.  After this preliminary tensioning, the knee was placed through several flexion-extension cycles to eliminate any graft settling or excursion and the graft was re-tensioned in the same manner and the sutures were tied over top of the buttons proximally and distally, and the four tibial sided suture arms were secondarily secured at the proximal tibia with 1 SwiveLock anchor. Of note we did also pass a free labral tape  through the ACL fixation as an separate internal brace backup fixation.   Final images of the ACL graft were obtained, revealing no lateral wall or roof impingement of the graft at the notch through range of motion.  Stability of the ACL graft was assessed and found to be normalized at grade 0 lachman and grade 0 pivot shift.    The wounds were all closed in layers per usual.  Dressings were applied and a brace placed And locked in 0 of flexion..  There were no apparent complications.  The patient was awakened and taken to recovery room in satisfactory condition.   POSTOPERATIVE PLAN:  Angelica Bond will be touch down weight bearing on crutches until cleared by The therapist.  They will likewise be in The knee brace with it locked until quad function is normalized. They will be on 81 mg asa daily for6 weeks for DVT PPX.  they will return to the clinic to see the surgeon in 2 weeks.   Selinda Belvie Gosling

## 2024-09-14 NOTE — Discharge Instructions (Addendum)
 DISCHARGE INSTRUCTIONS: ________________________________________________________________________________ ACL RECONSTRUCTION HOME EXERCISE PROGRAM (0-2 WEEKS)   Elevate the leg above your heart as often as possible. Weight bear as tolerated with the Bledsoe brace, use crutches as needed, progress from 2 to 1 crutch as able using one crutch on opposite side of surgical knee.  Do not limp and do not walk too much!! You should sleep in the knee brace with it locked in full extension.  You may remove for showering.  Otherwise  may remove for exercise. Start normal showering on postoperative day #3.  Do not submerge underwater Goals for first two weeks:  minimal swelling, motion 0-90, walking with brace without crutches and positive attitude about PT. Use pain medication as needed.  You may also take Tylenol  and Advil  around-the-clock in alternating fashion in addition to the pain medication.  To prevent constipation use Colace 100mg . twice a day while on pain medication.  If constipated, use Miralax 17 gm once a day and drink plenty of fluids.  These medications can be obtained at the pharmacy without a prescription.   Follow up in the office in 14 days. You may remove your postoperative bandages on the third day from surgery and begin showering.  Do not remove the Steri-Strips.  Do not submerge underwater.  Replace your Ace bandage over your wounds before reapplying your knee brace You should also continue to wear the TED hose for 2 weeks postoperatively. You should also take an 81 mg aspirin once per day x 6 weeks for the prevention of DVT.  No Tylenol  until after 3:15pm today. No Ibuprofen  until after 4:05pm today.  Post Anesthesia Home Care Instructions  Activity: Get plenty of rest for the remainder of the day. A responsible individual must stay with you for 24 hours following the procedure.  For the next 24 hours, DO NOT: -Drive a car -Advertising copywriter -Drink alcoholic beverages -Take any  medication unless instructed by your physician -Make any legal decisions or sign important papers.  Meals: Start with liquid foods such as gelatin or soup. Progress to regular foods as tolerated. Avoid greasy, spicy, heavy foods. If nausea and/or vomiting occur, drink only clear liquids until the nausea and/or vomiting subsides. Call your physician if vomiting continues.  Special Instructions/Symptoms: Your throat may feel dry or sore from the anesthesia or the breathing tube placed in your throat during surgery. If this causes discomfort, gargle with warm salt water. The discomfort should disappear within 24 hours.  If you had a scopolamine patch placed behind your ear for the management of post- operative nausea and/or vomiting:  1. The medication in the patch is effective for 72 hours, after which it should be removed.  Wrap patch in a tissue and discard in the trash. Wash hands thoroughly with soap and water. 2. You may remove the patch earlier than 72 hours if you experience unpleasant side effects which may include dry mouth, dizziness or visual disturbances. 3. Avoid touching the patch. Wash your hands with soap and water after contact with the patch.   Regional Anesthesia Blocks  1. You may not be able to move or feel the blocked extremity after a regional anesthetic block. This may last may last from 3-48 hours after placement, but it will go away. The length of time depends on the medication injected and your individual response to the medication. As the nerves start to wake up, you may experience tingling as the movement and feeling returns to your extremity. If the numbness and  inability to move your extremity has not gone away after 48 hours, please call your surgeon.   2. The extremity that is blocked will need to be protected until the numbness is gone and the strength has returned. Because you cannot feel it, you will need to take extra care to avoid injury. Because it may be  weak, you may have difficulty moving it or using it. You may not know what position it is in without looking at it while the block is in effect.  3. For blocks in the legs and feet, returning to weight bearing and walking needs to be done carefully. You will need to wait until the numbness is entirely gone and the strength has returned. You should be able to move your leg and foot normally before you try and bear weight or walk. You will need someone to be with you when you first try to ensure you do not fall and possibly risk injury.  4. Bruising and tenderness at the needle site are common side effects and will resolve in a few days.  5. Persistent numbness or new problems with movement should be communicated to the surgeon or the [facility] [phone] [facility] [phone]).

## 2024-09-14 NOTE — Anesthesia Procedure Notes (Signed)
 Procedure Name: Intubation Date/Time: 09/14/2024 9:00 AM  Performed by: Claudene Delon SQUIBB, CRNAPre-anesthesia Checklist: Patient identified, Emergency Drugs available, Suction available and Patient being monitored Patient Re-evaluated:Patient Re-evaluated prior to induction Oxygen Delivery Method: Circle System Utilized Preoxygenation: Pre-oxygenation with 100% oxygen Induction Type: IV induction Ventilation: Mask ventilation without difficulty Laryngoscope Size: Mac and 3 Grade View: Grade I Tube type: Oral Tube size: 7.0 mm Number of attempts: 1 Airway Equipment and Method: Stylet Placement Confirmation: ETT inserted through vocal cords under direct vision, positive ETCO2 and breath sounds checked- equal and bilateral Secured at: 21 cm Tube secured with: Tape Dental Injury: Teeth and Oropharynx as per pre-operative assessment

## 2024-09-14 NOTE — Anesthesia Postprocedure Evaluation (Signed)
 Anesthesia Post Note  Patient: Angelica Bond  Procedure(s) Performed: KNEE ARTHROSCOPY WITH ANTERIOR CRUCIATE LIGAMENT (ACL) RECONSTRUCTION WITH HAMSTRING GRAFT (Right: Knee) ARTHROSCOPY, KNEE, WITH MENISCUS REPAIR (Right: Knee)     Patient location during evaluation: PACU Anesthesia Type: General Level of consciousness: awake and alert and oriented Pain management: pain level controlled Vital Signs Assessment: post-procedure vital signs reviewed and stable Respiratory status: spontaneous breathing, nonlabored ventilation and respiratory function stable Cardiovascular status: blood pressure returned to baseline and stable Postop Assessment: no apparent nausea or vomiting Anesthetic complications: no   No notable events documented.  Last Vitals:  Vitals:   09/14/24 1100 09/14/24 1115  BP: (!) 171/104   Pulse: 82 76  Resp: 17 (!) 9  Temp:    SpO2: 98% 99%    Last Pain:  Vitals:   09/14/24 1115  TempSrc:   PainSc: 4                  Tilton Marsalis A.

## 2024-09-14 NOTE — Progress Notes (Signed)
 Orthopedic Tech Progress Note Patient Details:  Blia Totman [birth year] [id number] [facility] bledsoe brace [facility] Patient ID: Zephyra Bernardi, female   DOB: 18-Jun-1991, 33 y.o.   MRN: [id number]  Efrain DELENA Cos 09/14/2024, 9:22 AM

## 2024-09-18 ENCOUNTER — Encounter (HOSPITAL_BASED_OUTPATIENT_CLINIC_OR_DEPARTMENT_OTHER): Payer: Self-pay | Admitting: Orthopedic Surgery
